# Patient Record
Sex: Male | Born: 1945
Health system: Southern US, Community
[De-identification: ages and names within clinical notes are randomized; demographics above are authoritative.]

## PROBLEM LIST (undated history)

## (undated) DIAGNOSIS — G4733 Obstructive sleep apnea (adult) (pediatric): Secondary | ICD-10-CM

## (undated) DIAGNOSIS — R200 Anesthesia of skin: Secondary | ICD-10-CM

## (undated) DIAGNOSIS — J302 Other seasonal allergic rhinitis: Secondary | ICD-10-CM

## (undated) DIAGNOSIS — I499 Cardiac arrhythmia, unspecified: Secondary | ICD-10-CM

## (undated) DIAGNOSIS — R202 Paresthesia of skin: Secondary | ICD-10-CM

## (undated) DIAGNOSIS — I10 Essential (primary) hypertension: Secondary | ICD-10-CM

## (undated) DIAGNOSIS — M1990 Unspecified osteoarthritis, unspecified site: Secondary | ICD-10-CM

## (undated) HISTORY — DX: Obstructive sleep apnea (adult) (pediatric): G47.33

## (undated) HISTORY — PX: ROTATOR CUFF REPAIR: SHX139

## (undated) HISTORY — PX: COLONOSCOPY W/ BIOPSIES AND POLYPECTOMY: SHX1376

---

## 2004-03-08 ENCOUNTER — Encounter: Admission: RE | Admit: 2004-03-08 | Discharge: 2004-03-08 | Payer: Self-pay | Admitting: Emergency Medicine

## 2004-03-09 ENCOUNTER — Encounter: Admission: RE | Admit: 2004-03-09 | Discharge: 2004-03-09 | Payer: Self-pay | Admitting: Emergency Medicine

## 2005-03-26 ENCOUNTER — Encounter: Admission: RE | Admit: 2005-03-26 | Discharge: 2005-03-26 | Payer: Self-pay | Admitting: Emergency Medicine

## 2005-04-22 ENCOUNTER — Encounter: Admission: RE | Admit: 2005-04-22 | Discharge: 2005-04-22 | Payer: Self-pay | Admitting: Emergency Medicine

## 2005-05-12 ENCOUNTER — Encounter: Admission: RE | Admit: 2005-05-12 | Discharge: 2005-05-12 | Payer: Self-pay | Admitting: Emergency Medicine

## 2005-10-13 ENCOUNTER — Encounter: Admission: RE | Admit: 2005-10-13 | Discharge: 2005-10-13 | Payer: Self-pay | Admitting: Emergency Medicine

## 2005-11-03 ENCOUNTER — Encounter: Payer: Self-pay | Admitting: Emergency Medicine

## 2006-06-08 ENCOUNTER — Encounter: Admission: RE | Admit: 2006-06-08 | Discharge: 2006-06-08 | Payer: Self-pay | Admitting: Emergency Medicine

## 2006-07-13 ENCOUNTER — Encounter: Admission: RE | Admit: 2006-07-13 | Discharge: 2006-07-13 | Payer: Self-pay | Admitting: Emergency Medicine

## 2006-08-25 ENCOUNTER — Encounter: Admission: RE | Admit: 2006-08-25 | Discharge: 2006-08-25 | Payer: Self-pay | Admitting: Emergency Medicine

## 2006-08-30 ENCOUNTER — Encounter: Admission: RE | Admit: 2006-08-30 | Discharge: 2006-08-30 | Payer: Self-pay | Admitting: Emergency Medicine

## 2006-12-23 ENCOUNTER — Encounter: Admission: RE | Admit: 2006-12-23 | Discharge: 2006-12-23 | Payer: Self-pay | Admitting: Orthopedic Surgery

## 2007-01-26 ENCOUNTER — Encounter: Admission: RE | Admit: 2007-01-26 | Discharge: 2007-01-26 | Payer: Self-pay | Admitting: Orthopedic Surgery

## 2007-02-16 ENCOUNTER — Encounter: Admission: RE | Admit: 2007-02-16 | Discharge: 2007-02-16 | Payer: Self-pay | Admitting: Orthopedic Surgery

## 2007-10-09 ENCOUNTER — Encounter: Admission: RE | Admit: 2007-10-09 | Discharge: 2007-10-09 | Payer: Self-pay | Admitting: Emergency Medicine

## 2007-10-19 ENCOUNTER — Encounter: Admission: RE | Admit: 2007-10-19 | Discharge: 2007-10-19 | Payer: Self-pay | Admitting: Emergency Medicine

## 2007-11-08 ENCOUNTER — Encounter: Admission: RE | Admit: 2007-11-08 | Discharge: 2007-11-08 | Payer: Self-pay | Admitting: Neurosurgery

## 2007-12-15 ENCOUNTER — Encounter: Admission: RE | Admit: 2007-12-15 | Discharge: 2007-12-15 | Payer: Self-pay | Admitting: Neurosurgery

## 2008-01-14 ENCOUNTER — Encounter: Admission: RE | Admit: 2008-01-14 | Discharge: 2008-01-14 | Payer: Self-pay | Admitting: Neurosurgery

## 2008-02-08 ENCOUNTER — Encounter: Admission: RE | Admit: 2008-02-08 | Discharge: 2008-02-08 | Payer: Self-pay | Admitting: Neurosurgery

## 2008-06-09 ENCOUNTER — Encounter: Admission: RE | Admit: 2008-06-09 | Discharge: 2008-06-09 | Payer: Self-pay | Admitting: Emergency Medicine

## 2008-07-05 ENCOUNTER — Encounter: Admission: RE | Admit: 2008-07-05 | Discharge: 2008-07-05 | Payer: Self-pay | Admitting: Emergency Medicine

## 2008-08-15 ENCOUNTER — Encounter: Admission: RE | Admit: 2008-08-15 | Discharge: 2008-08-15 | Payer: Self-pay | Admitting: Emergency Medicine

## 2010-07-14 ENCOUNTER — Encounter: Payer: Self-pay | Admitting: Neurosurgery

## 2010-07-22 ENCOUNTER — Encounter
Admission: RE | Admit: 2010-07-22 | Discharge: 2010-07-22 | Payer: Self-pay | Source: Home / Self Care | Attending: Family Medicine | Admitting: Family Medicine

## 2010-07-25 ENCOUNTER — Other Ambulatory Visit: Payer: Self-pay | Admitting: Family Medicine

## 2010-07-25 DIAGNOSIS — E871 Hypo-osmolality and hyponatremia: Secondary | ICD-10-CM

## 2010-07-29 ENCOUNTER — Ambulatory Visit
Admission: RE | Admit: 2010-07-29 | Discharge: 2010-07-29 | Disposition: A | Discharge status: Home / Self Care | Payer: BC Managed Care – PPO | Source: Ambulatory Visit | Attending: Family Medicine | Admitting: Family Medicine

## 2010-07-29 DIAGNOSIS — E871 Hypo-osmolality and hyponatremia: Secondary | ICD-10-CM

## 2010-07-29 MED ORDER — IOHEXOL 300 MG/ML  SOLN
75.0000 mL | Freq: Once | INTRAMUSCULAR | Status: AC | PRN
  Administered 2010-07-29: 75 mL via INTRAVENOUS

## 2013-03-10 ENCOUNTER — Other Ambulatory Visit: Payer: Self-pay | Admitting: Neurological Surgery

## 2013-03-10 DIAGNOSIS — M48061 Spinal stenosis, lumbar region without neurogenic claudication: Secondary | ICD-10-CM

## 2013-03-11 ENCOUNTER — Ambulatory Visit
Admission: RE | Admit: 2013-03-11 | Discharge: 2013-03-11 | Disposition: A | Discharge status: Home / Self Care | Payer: Medicare HMO | Source: Ambulatory Visit | Attending: Neurological Surgery | Admitting: Neurological Surgery

## 2013-03-11 DIAGNOSIS — M48061 Spinal stenosis, lumbar region without neurogenic claudication: Secondary | ICD-10-CM

## 2013-03-14 ENCOUNTER — Other Ambulatory Visit: Payer: Self-pay | Admitting: Neurological Surgery

## 2013-03-18 ENCOUNTER — Encounter (HOSPITAL_COMMUNITY): Payer: Self-pay | Admitting: Pharmacy Technician

## 2013-03-22 ENCOUNTER — Encounter (HOSPITAL_COMMUNITY): Payer: Self-pay

## 2013-03-22 ENCOUNTER — Encounter (HOSPITAL_COMMUNITY)
Admission: RE | Admit: 2013-03-22 | Discharge: 2013-03-22 | Disposition: A | Discharge status: Home / Self Care | Payer: Medicare HMO | Source: Ambulatory Visit | Attending: Neurological Surgery | Admitting: Neurological Surgery

## 2013-03-22 ENCOUNTER — Encounter (HOSPITAL_COMMUNITY)
Admission: RE | Admit: 2013-03-22 | Discharge: 2013-03-22 | Disposition: A | Discharge status: Home / Self Care | Payer: Medicare HMO | Source: Ambulatory Visit | Attending: Anesthesiology | Admitting: Anesthesiology

## 2013-03-22 HISTORY — DX: Unspecified osteoarthritis, unspecified site: M19.90

## 2013-03-22 HISTORY — DX: Paresthesia of skin: R20.2

## 2013-03-22 HISTORY — DX: Paresthesia of skin: R20.0

## 2013-03-22 HISTORY — DX: Essential (primary) hypertension: I10

## 2013-03-22 LAB — SURGICAL PCR SCREEN
MRSA, PCR: NEGATIVE
Staphylococcus aureus: POSITIVE — AB

## 2013-03-22 LAB — BASIC METABOLIC PANEL
BUN: 13 mg/dL (ref 6–23)
CO2: 26 mEq/L (ref 19–32)
Chloride: 100 mEq/L (ref 96–112)
GFR calc Af Amer: >90 mL/min (ref >90)
Potassium: 4.5 mEq/L (ref 3.5–5.1)

## 2013-03-22 LAB — CBC
HCT: 39.4 % (ref 39.0–52.0)
Hemoglobin: 14.2 g/dL (ref 13.0–17.0)
MCV: 91.6 fL (ref 78.0–100.0)
WBC: 6.4 10*3/uL (ref 4.0–10.5)

## 2013-03-22 NOTE — Progress Notes (Signed)
Anesthesia Chart Review:  Patient is a 67 year old male scheduled for L3-4, L4-5 laminectomy/foraminotomy on 03/24/13 by Dr. Yetta Barre. History of HTN, arthritis, former smoker, obesity (BMI 36.95). PCP is Dr. Blair Heys, [phone].   I was asked to review EKGs X 2 done today during his PAT visit.  Patient was asked to wait, but left before I saw him.  EKGs were done at 1609 and 1611.  The first EKG showed a critical result of second degree AVB (Mobitz 1).  The later EKG showed SR with PACs, incomplete right BBB.  I called and spoke with cardiologist Dr. Anne Fu.  I reviewed the 1609 EKG results with him since he did not have a tracing available at his location.  He was able to view the 1611 EKG which he felt also showed evidence of Wenckebach.  I then reviewed patient's plans for surgery, history, and medications with Dr. Delanna Notice whether he felt this patient would benefit from a preoperative cardiology evaluation.  Dr. Anne Fu felt that if patient was asymptomatic then a 2nd degree AVB, Mobitz 1 would not preclude him from undergoing surgery.  I called and spoke with Damon Weaver and reviewed his EKG findings.  He denied chest pain, SOB, syncope/pre-syncope, CHF/edema.  I informed him of Dr. Judd Gaudier input and that I would forward findings to Dr. Manus Gunning and Dr. Yetta Barre.  Meds: MVI, lisinopril, pravastatin.  CXR on 03/22/13 showed no acute abnormalities.  Preoperative labs noted--CBC and BMET WNL.  I reviewed EKG and input from Dr. Anne Fu with anesthesiologist Dr. Gypsy Balsam.  If no acute changes/new CV symptomology then anticipate that he could proceed as planned.  Patient will be on telemetry while under the care of his anesthesiologist.  He may benefit from post-operative telemetry as well.    Velna Ochs [facility]/Anesthesiology Phone [phone] 03/22/2013 5:35 PM

## 2013-03-22 NOTE — Progress Notes (Signed)
Pt chart left for [place], [place] (anesthesia) to review pt chest x ray.

## 2013-03-22 NOTE — Pre-Procedure Instructions (Signed)
Damon Weaver  03/22/2013   Your procedure is scheduled on: Thursday, March 24, 2013  Report to [facility] Short Stay (use Main Entrance "A') at 11:45 AM.  Call this number if you have problems the morning of surgery: [phone]   Remember:   Do not eat food or drink liquids after midnight.   Take these medicines the morning of surgery with A SIP OF WATER: None Stop taking Aspirin, Multivitamins, and herbal medications. Do not take any NSAIDs ie: Ibuprofen, Advil, Naproxen or any medication containing Aspirin.  Do not wear jewelry, make-up or nail polish.  Do not wear lotions, powders, or perfumes. You may wear deodorant.  Do not shave 48 hours prior to surgery. Men may shave face and neck.  Do not bring valuables to the hospital.  [facility] is not responsible for any belongings or valuables.               Contacts, dentures or bridgework may not be worn into surgery.  Leave suitcase in the car. After surgery it may be brought to your room.  For patients admitted to the hospital, discharge time is determined by your treatment team.               Patients discharged the day of surgery will not be allowed to drive home.  Name and phone number of your driver:   Special Instructions: Shower using CHG 2 nights before surgery and the night before surgery.  If you shower the day of surgery use CHG.  Use special wash - you have one bottle of CHG for all showers.  You should use approximately 1/3 of the bottle for each shower.   Please read over the following fact sheets that you were given: Pain Booklet, Coughing and Deep Breathing, Blood Transfusion Information, MRSA Information and Surgical Site Infection Prevention

## 2013-03-22 NOTE — Progress Notes (Signed)
03/22/13 1528  OBSTRUCTIVE SLEEP APNEA  Have you ever been diagnosed with sleep apnea through a sleep study? No  Do you snore loudly (loud enough to be heard through closed doors)?  0  Do you often feel tired, fatigued, or sleepy during the daytime? 0  Has anyone observed you stop breathing during your sleep? 0  Do you have, or are you being treated for high blood pressure? 1  BMI more than 35 kg/m2? 1  Age over 65 years old? 1  Neck circumference greater than 40 cm/18 inches? 0  Gender: 1  Obstructive Sleep Apnea Score 4  Score 4 or greater  Results sent to PCP

## 2013-03-22 NOTE — Progress Notes (Signed)
Pt denies SOB, chest pain, and being under the care of a cardiologist. Pt denies having a chest x ray and EKG within the last year and any cardiac studies. Pt PCP made aware of STOP-BANG Assessment Tool results.

## 2013-03-22 NOTE — Progress Notes (Signed)
Revonda Standard, PA (anesthesia)  reviewed pt abnormal EKG.

## 2013-03-23 MED ORDER — CEFAZOLIN SODIUM-DEXTROSE 2-3 GM-% IV SOLR
2.0000 g | INTRAVENOUS | Status: AC
  Administered 2013-03-24: 2 g via INTRAVENOUS
  Filled 2013-03-23: qty 50

## 2013-03-23 NOTE — Progress Notes (Signed)
Called patient and inst to arrive at 1100. Message left on ans machine

## 2013-03-24 ENCOUNTER — Encounter (HOSPITAL_COMMUNITY): Payer: Self-pay | Admitting: Vascular Surgery

## 2013-03-24 ENCOUNTER — Inpatient Hospital Stay (HOSPITAL_COMMUNITY)
Admission: RE | Admit: 2013-03-24 | Discharge: 2013-03-25 | DRG: 520 | Disposition: A | Discharge status: Home / Self Care | Payer: Medicare HMO | Source: Ambulatory Visit | Attending: Neurological Surgery | Admitting: Neurological Surgery

## 2013-03-24 ENCOUNTER — Encounter (HOSPITAL_COMMUNITY): Payer: Self-pay | Admitting: Surgery

## 2013-03-24 ENCOUNTER — Encounter (HOSPITAL_COMMUNITY)
Admission: RE | Disposition: A | Discharge status: Home / Self Care | Payer: Self-pay | Source: Ambulatory Visit | Attending: Neurological Surgery

## 2013-03-24 ENCOUNTER — Inpatient Hospital Stay (HOSPITAL_COMMUNITY): Payer: Medicare HMO | Admitting: Anesthesiology

## 2013-03-24 ENCOUNTER — Inpatient Hospital Stay (HOSPITAL_COMMUNITY): Payer: Medicare HMO

## 2013-03-24 DIAGNOSIS — Z87891 Personal history of nicotine dependence: Secondary | ICD-10-CM

## 2013-03-24 DIAGNOSIS — Z9889 Other specified postprocedural states: Secondary | ICD-10-CM

## 2013-03-24 DIAGNOSIS — I10 Essential (primary) hypertension: Secondary | ICD-10-CM | POA: Diagnosis present

## 2013-03-24 DIAGNOSIS — M48061 Spinal stenosis, lumbar region without neurogenic claudication: Principal | ICD-10-CM | POA: Diagnosis present

## 2013-03-24 DIAGNOSIS — Z23 Encounter for immunization: Secondary | ICD-10-CM

## 2013-03-24 DIAGNOSIS — Z8261 Family history of arthritis: Secondary | ICD-10-CM

## 2013-03-24 DIAGNOSIS — Z79899 Other long term (current) drug therapy: Secondary | ICD-10-CM

## 2013-03-24 DIAGNOSIS — Z01812 Encounter for preprocedural laboratory examination: Secondary | ICD-10-CM

## 2013-03-24 HISTORY — PX: LUMBAR LAMINECTOMY/DECOMPRESSION MICRODISCECTOMY: SHX5026

## 2013-03-24 SURGERY — LUMBAR LAMINECTOMY/DECOMPRESSION MICRODISCECTOMY 2 LEVELS
Anesthesia: General | Site: Back | Wound class: Clean

## 2013-03-24 MED ORDER — SODIUM CHLORIDE 0.9 % IJ SOLN: 3.0000 mL | INTRAMUSCULAR | Status: DC | PRN

## 2013-03-24 MED ORDER — ONDANSETRON HCL 4 MG/2ML IJ SOLN
INTRAMUSCULAR | Status: DC | PRN
  Administered 2013-03-24: 4 mg via INTRAVENOUS

## 2013-03-24 MED ORDER — LACTATED RINGERS IV SOLN
INTRAVENOUS | Status: DC
  Administered 2013-03-24: 11:00:00 via INTRAVENOUS

## 2013-03-24 MED ORDER — ONDANSETRON HCL 4 MG/2ML IJ SOLN: 4.0000 mg | INTRAMUSCULAR | Status: DC | PRN

## 2013-03-24 MED ORDER — OXYCODONE-ACETAMINOPHEN 5-325 MG PO TABS
1.0000 | ORAL_TABLET | ORAL | Status: DC | PRN
  Administered 2013-03-25: 2 via ORAL
  Filled 2013-03-24: qty 2

## 2013-03-24 MED ORDER — LIDOCAINE HCL (CARDIAC) 20 MG/ML IV SOLN
INTRAVENOUS | Status: DC | PRN
  Administered 2013-03-24: 80 mg via INTRAVENOUS

## 2013-03-24 MED ORDER — MORPHINE SULFATE 2 MG/ML IJ SOLN: 1.0000 mg | INTRAMUSCULAR | Status: DC | PRN

## 2013-03-24 MED ORDER — LACTATED RINGERS IV SOLN
INTRAVENOUS | Status: DC | PRN
  Administered 2013-03-24 (×3): via INTRAVENOUS

## 2013-03-24 MED ORDER — DEXAMETHASONE SODIUM PHOSPHATE 4 MG/ML IJ SOLN
4.0000 mg | Freq: Four times a day (QID) | INTRAMUSCULAR | Status: DC
  Filled 2013-03-24 (×4): qty 1

## 2013-03-24 MED ORDER — PROPOFOL 10 MG/ML IV BOLUS
INTRAVENOUS | Status: DC | PRN
  Administered 2013-03-24: 200 mg via INTRAVENOUS

## 2013-03-24 MED ORDER — GLYCOPYRROLATE 0.2 MG/ML IJ SOLN
INTRAMUSCULAR | Status: DC | PRN
  Administered 2013-03-24: .8 mg via INTRAVENOUS
  Administered 2013-03-24: 0.2 mg via INTRAVENOUS

## 2013-03-24 MED ORDER — THROMBIN 5000 UNITS EX SOLR
CUTANEOUS | Status: DC | PRN
  Administered 2013-03-24 (×2): 5000 [IU] via TOPICAL

## 2013-03-24 MED ORDER — ARTIFICIAL TEARS OP OINT
TOPICAL_OINTMENT | OPHTHALMIC | Status: DC | PRN
  Administered 2013-03-24: 1 via OPHTHALMIC

## 2013-03-24 MED ORDER — MIDAZOLAM HCL 5 MG/5ML IJ SOLN
INTRAMUSCULAR | Status: DC | PRN
  Administered 2013-03-24: 2 mg via INTRAVENOUS

## 2013-03-24 MED ORDER — PHENOL 1.4 % MT LIQD: 1.0000 | OROMUCOSAL | Status: DC | PRN

## 2013-03-24 MED ORDER — ACETAMINOPHEN 650 MG RE SUPP: 650.0000 mg | RECTAL | Status: DC | PRN

## 2013-03-24 MED ORDER — CEFAZOLIN SODIUM 1-5 GM-% IV SOLN
1.0000 g | Freq: Three times a day (TID) | INTRAVENOUS | Status: AC
  Administered 2013-03-24 – 2013-03-25 (×2): 1 g via INTRAVENOUS
  Filled 2013-03-24 (×3): qty 50

## 2013-03-24 MED ORDER — METHOCARBAMOL 100 MG/ML IJ SOLN
500.0000 mg | Freq: Four times a day (QID) | INTRAVENOUS | Status: DC | PRN
  Filled 2013-03-24: qty 5

## 2013-03-24 MED ORDER — DEXAMETHASONE SODIUM PHOSPHATE 4 MG/ML IJ SOLN
INTRAMUSCULAR | Status: DC | PRN
  Administered 2013-03-24: 8 mg via INTRAVENOUS

## 2013-03-24 MED ORDER — INFLUENZA VAC SPLIT QUAD 0.5 ML IM SUSP
0.5000 mL | INTRAMUSCULAR | Status: AC
  Administered 2013-03-25: 0.5 mL via INTRAMUSCULAR
  Filled 2013-03-24: qty 0.5

## 2013-03-24 MED ORDER — OXYCODONE HCL 5 MG PO TABS
ORAL_TABLET | ORAL | Status: AC
  Administered 2013-03-24: 5 mg
  Filled 2013-03-24: qty 1

## 2013-03-24 MED ORDER — ROCURONIUM BROMIDE 100 MG/10ML IV SOLN
INTRAVENOUS | Status: DC | PRN
  Administered 2013-03-24: 10 mg via INTRAVENOUS
  Administered 2013-03-24: 20 mg via INTRAVENOUS
  Administered 2013-03-24: 50 mg via INTRAVENOUS
  Administered 2013-03-24 (×2): 10 mg via INTRAVENOUS

## 2013-03-24 MED ORDER — BUPIVACAINE HCL (PF) 0.25 % IJ SOLN
INTRAMUSCULAR | Status: DC | PRN
  Administered 2013-03-24: 3 mL

## 2013-03-24 MED ORDER — ACETAMINOPHEN 325 MG PO TABS: 650.0000 mg | ORAL_TABLET | ORAL | Status: DC | PRN

## 2013-03-24 MED ORDER — METHOCARBAMOL 500 MG PO TABS
500.0000 mg | ORAL_TABLET | Freq: Four times a day (QID) | ORAL | Status: DC | PRN
  Filled 2013-03-24 (×2): qty 1

## 2013-03-24 MED ORDER — FENTANYL CITRATE 0.05 MG/ML IJ SOLN
INTRAMUSCULAR | Status: DC | PRN
  Administered 2013-03-24: 50 ug via INTRAVENOUS
  Administered 2013-03-24: 100 ug via INTRAVENOUS
  Administered 2013-03-24: 150 ug via INTRAVENOUS
  Administered 2013-03-24: 50 ug via INTRAVENOUS

## 2013-03-24 MED ORDER — LISINOPRIL 10 MG PO TABS
10.0000 mg | ORAL_TABLET | Freq: Every day | ORAL | Status: DC
  Administered 2013-03-24: 10 mg via ORAL
  Filled 2013-03-24 (×2): qty 1

## 2013-03-24 MED ORDER — NEOSTIGMINE METHYLSULFATE 1 MG/ML IJ SOLN
INTRAMUSCULAR | Status: DC | PRN
  Administered 2013-03-24: 5 mg via INTRAVENOUS

## 2013-03-24 MED ORDER — THROMBIN 5000 UNITS EX SOLR
OROMUCOSAL | Status: DC | PRN
  Administered 2013-03-24: 15:00:00 via TOPICAL

## 2013-03-24 MED ORDER — SODIUM CHLORIDE 0.9 % IV SOLN: 250.0000 mL | INTRAVENOUS | Status: DC

## 2013-03-24 MED ORDER — OXYCODONE HCL 5 MG PO TABS: 5.0000 mg | ORAL_TABLET | Freq: Once | ORAL | Status: DC

## 2013-03-24 MED ORDER — 0.9 % SODIUM CHLORIDE (POUR BTL) OPTIME
TOPICAL | Status: DC | PRN
  Administered 2013-03-24: 1000 mL

## 2013-03-24 MED ORDER — POTASSIUM CHLORIDE IN NACL 20-0.9 MEQ/L-% IV SOLN
INTRAVENOUS | Status: DC
  Filled 2013-03-24 (×3): qty 1000

## 2013-03-24 MED ORDER — MENTHOL 3 MG MT LOZG: 1.0000 | LOZENGE | OROMUCOSAL | Status: DC | PRN

## 2013-03-24 MED ORDER — DEXAMETHASONE 4 MG PO TABS
4.0000 mg | ORAL_TABLET | Freq: Four times a day (QID) | ORAL | Status: DC
  Administered 2013-03-24 – 2013-03-25 (×3): 4 mg via ORAL
  Filled 2013-03-24 (×7): qty 1

## 2013-03-24 MED ORDER — BACITRACIN 50000 UNITS IM SOLR
INTRAMUSCULAR | Status: DC | PRN
  Administered 2013-03-24: 13:00:00

## 2013-03-24 MED ORDER — MUPIROCIN 2 % EX OINT
TOPICAL_OINTMENT | Freq: Two times a day (BID) | CUTANEOUS | Status: DC
  Administered 2013-03-24: 23:00:00 via NASAL
  Filled 2013-03-24: qty 22

## 2013-03-24 MED ORDER — SODIUM CHLORIDE 0.9 % IJ SOLN
3.0000 mL | Freq: Two times a day (BID) | INTRAMUSCULAR | Status: DC
  Administered 2013-03-24: 3 mL via INTRAVENOUS

## 2013-03-24 MED ORDER — HEMOSTATIC AGENTS (NO CHARGE) OPTIME
TOPICAL | Status: DC | PRN
  Administered 2013-03-24: 1 via TOPICAL

## 2013-03-24 MED ORDER — SENNA 8.6 MG PO TABS
1.0000 | ORAL_TABLET | Freq: Two times a day (BID) | ORAL | Status: DC
  Administered 2013-03-24: 8.6 mg via ORAL
  Filled 2013-03-24 (×3): qty 1

## 2013-03-24 SURGICAL SUPPLY — 52 items
ADH SKN CLS APL DERMABOND .7 (GAUZE/BANDAGES/DRESSINGS) ×1
APL SKNCLS STERI-STRIP NONHPOA (GAUZE/BANDAGES/DRESSINGS) ×1
BAG DECANTER FOR FLEXI CONT (MISCELLANEOUS) ×2 IMPLANT
BENZOIN TINCTURE PRP APPL 2/3 (GAUZE/BANDAGES/DRESSINGS) ×2 IMPLANT
BUR MATCHSTICK NEURO 3.0 LAGG (BURR) ×2 IMPLANT
CANISTER SUCTION 2500CC (MISCELLANEOUS) ×2 IMPLANT
CLOTH BEACON ORANGE TIMEOUT ST (SAFETY) IMPLANT
CONT SPEC 4OZ CLIKSEAL STRL BL (MISCELLANEOUS) ×2 IMPLANT
DERMABOND ADVANCED (GAUZE/BANDAGES/DRESSINGS) ×1
DERMABOND ADVANCED .7 DNX12 (GAUZE/BANDAGES/DRESSINGS) IMPLANT
DRAPE LAPAROTOMY 100X72X124 (DRAPES) ×2 IMPLANT
DRAPE MICROSCOPE LEICA (MISCELLANEOUS) ×2 IMPLANT
DRAPE POUCH INSTRU U-SHP 10X18 (DRAPES) ×2 IMPLANT
DRAPE SURG 17X23 STRL (DRAPES) ×2 IMPLANT
DRESSING TELFA 8X3 (GAUZE/BANDAGES/DRESSINGS) ×1 IMPLANT
DRSG OPSITE 4X5.5 SM (GAUZE/BANDAGES/DRESSINGS) ×1 IMPLANT
DRSG OPSITE POSTOP 4X6 (GAUZE/BANDAGES/DRESSINGS) ×1 IMPLANT
DURAPREP 26ML APPLICATOR (WOUND CARE) ×2 IMPLANT
ELECT BLADE 4.0 EZ CLEAN MEGAD (MISCELLANEOUS) ×2
ELECT REM PT RETURN 9FT ADLT (ELECTROSURGICAL) ×2
ELECTRODE BLDE 4.0 EZ CLN MEGD (MISCELLANEOUS) ×1 IMPLANT
ELECTRODE REM PT RTRN 9FT ADLT (ELECTROSURGICAL) ×1 IMPLANT
EVACUATOR 1/8 PVC DRAIN (DRAIN) ×1 IMPLANT
GAUZE SPONGE 4X4 16PLY XRAY LF (GAUZE/BANDAGES/DRESSINGS) IMPLANT
GLOVE BIO SURGEON STRL SZ 6.5 (GLOVE) ×2 IMPLANT
GLOVE BIO SURGEON STRL SZ8 (GLOVE) ×2 IMPLANT
GLOVE BIOGEL PI IND STRL 6.5 (GLOVE) IMPLANT
GLOVE BIOGEL PI INDICATOR 6.5 (GLOVE) ×1
GOWN BRE IMP SLV AUR LG STRL (GOWN DISPOSABLE) ×2 IMPLANT
GOWN BRE IMP SLV AUR XL STRL (GOWN DISPOSABLE) ×2 IMPLANT
GOWN STRL REIN 2XL LVL4 (GOWN DISPOSABLE) IMPLANT
HEMOSTAT POWDER KIT SURGIFOAM (HEMOSTASIS) IMPLANT
KIT BASIN OR (CUSTOM PROCEDURE TRAY) ×2 IMPLANT
KIT ROOM TURNOVER OR (KITS) ×2 IMPLANT
NDL HYPO 25X1 1.5 SAFETY (NEEDLE) ×1 IMPLANT
NDL SPNL 20GX3.5 QUINCKE YW (NEEDLE) IMPLANT
NEEDLE HYPO 25X1 1.5 SAFETY (NEEDLE) ×2 IMPLANT
NEEDLE SPNL 20GX3.5 QUINCKE YW (NEEDLE) IMPLANT
NS IRRIG 1000ML POUR BTL (IV SOLUTION) ×2 IMPLANT
PACK LAMINECTOMY NEURO (CUSTOM PROCEDURE TRAY) ×2 IMPLANT
PAD ARMBOARD 7.5X6 YLW CONV (MISCELLANEOUS) ×6 IMPLANT
RUBBERBAND STERILE (MISCELLANEOUS) ×4 IMPLANT
SPONGE SURGIFOAM ABS GEL SZ50 (HEMOSTASIS) ×3 IMPLANT
STRIP CLOSURE SKIN 1/2X4 (GAUZE/BANDAGES/DRESSINGS) ×2 IMPLANT
SUT VIC AB 0 CT1 18XCR BRD8 (SUTURE) ×1 IMPLANT
SUT VIC AB 0 CT1 8-18 (SUTURE) ×2
SUT VIC AB 2-0 CP2 18 (SUTURE) ×2 IMPLANT
SUT VIC AB 3-0 SH 8-18 (SUTURE) ×4 IMPLANT
SYR 20ML ECCENTRIC (SYRINGE) ×2 IMPLANT
TOWEL OR 17X24 6PK STRL BLUE (TOWEL DISPOSABLE) ×2 IMPLANT
TOWEL OR 17X26 10 PK STRL BLUE (TOWEL DISPOSABLE) ×2 IMPLANT
WATER STERILE IRR 1000ML POUR (IV SOLUTION) ×2 IMPLANT

## 2013-03-24 NOTE — Op Note (Signed)
03/24/2013  4:05 PM  PATIENT:  Damon Weaver  67 y.o. male  PRE-OPERATIVE DIAGNOSIS:  Lumbar spinal stenosis L3-4, L4-5 with left leg pain  POST-OPERATIVE DIAGNOSIS:  same  PROCEDURE:  Decompressive lumbar hemilaminectomy, medial facetectomy, and foraminotomy L3-4 and L4-5 on the left, with sublaminar decompression L3-4  SURGEON:  Marikay Alar, MD  ASSISTANTS: Dr. Gerlene Fee  ANESTHESIA:   General  EBL: 100 ml  Total I/O In: 2000 [I.V.:2000] Out: 100 [Blood:100]  BLOOD ADMINISTERED:none  DRAINS: med hemovac   SPECIMEN:  No Specimen  INDICATION FOR PROCEDURE: This patient presented with severe left leg pain. It been there for quite a long time. It had failed to respond to medical therapy. MRI showed spinal stenosis at L3-4 and L4-5. I recommended decompressive laminectomy. Patient understood the risks, benefits, and alternatives and potential outcomes and wished to proceed.  PROCEDURE DETAILS: The patient was taken to the operating room and after induction of adequate generalized endotracheal anesthesia, the patient was rolled into the prone position on the Wilson frame and all pressure points were padded. The lumbar region was cleaned and then prepped with DuraPrep and draped in the usual sterile fashion. 5 cc of local anesthesia was injected and then a dorsal midline incision was made and carried down to the lumbo sacral fascia. The fascia was opened and the paraspinous musculature was taken down in a subperiosteal fashion to expose L3-4 and L4-5 on the left. Intraoperative x-ray confirmed my level, and then I used a combination of the high-speed drill and the Kerrison punches to perform a hemilaminectomy, medial facetectomy, and foraminotomy at L3-4 and L4-5 on the left. The underlying yellow ligament was opened and removed in a piecemeal fashion to expose the underlying dura and exiting nerve root. I undercut the lateral recess and dissected down until I was medial to and distal to  the pedicle. The nerve root was well decompressed at both levels. At L4-5 on the left there was a large piece of bone fragment that was loose from the facet was compressing the left L5 nerve root. I continued to tease this loose from its ligamentous attachments with I nerve hook and final removed a large chunk of bone from the surface of the nerve root. We then gently retracted the nerve root medially with a retractor, coagulated the epidural venous vasculature, and i inspected the disc space. I found only small subannular disc herniations that I felt did not need discectomy. I then palpated with a coronary dilator along the nerve root and into the foramen to assure adequate decompression. I felt no more compression of the nerve root. I irrigated with saline solution containing bacitracin. Achieved hemostasis with bipolar cautery, lined the dura with Gelfoam, and then closed the fascia with 0 Vicryl. I closed the subcutaneous tissues with 2-0 Vicryl and the subcuticular tissues with 3-0 Vicryl. The skin was then closed with benzoin and Steri-Strips. The drapes were removed, a sterile dressing was applied. The patient was awakened from general anesthesia and transferred to the recovery room in stable condition. At the end of the procedure all sponge, needle and instrument counts were correct.   PLAN OF CARE: Admit to inpatient   PATIENT DISPOSITION:  PACU - hemodynamically stable.   Delay start of Pharmacological VTE agent (>24hrs) due to surgical blood loss or risk of bleeding:  yes

## 2013-03-24 NOTE — Anesthesia Preprocedure Evaluation (Addendum)
Anesthesia Evaluation  Patient identified by MRN, date of birth, ID band Patient awake    Reviewed: Allergy & Precautions, H&P , NPO status , Patient's Chart, lab work & pertinent test results, reviewed documented beta blocker date and time   Airway Mallampati: II TM Distance: >3 FB Neck ROM: full    Dental   Pulmonary neg pulmonary ROS,  breath sounds clear to auscultation        Cardiovascular hypertension, On Medications Rhythm:regular     Neuro/Psych negative neurological ROS  negative psych ROS   GI/Hepatic negative GI ROS, Neg liver ROS,   Endo/Other  negative endocrine ROS  Renal/GU negative Renal ROS  negative genitourinary   Musculoskeletal   Abdominal   Peds  Hematology negative hematology ROS (+)   Anesthesia Other Findings See surgeon's H&P   Reproductive/Obstetrics negative OB ROS                           Anesthesia Physical Anesthesia Plan  ASA: III  Anesthesia Plan: General   Post-op Pain Management:    Induction:   Airway Management Planned:   Additional Equipment: Arterial line  Intra-op Plan:   Post-operative Plan:   Informed Consent: I have reviewed the patients History and Physical, chart, labs and discussed the procedure including the risks, benefits and alternatives for the proposed anesthesia with the patient or authorized representative who has indicated his/her understanding and acceptance.   Dental Advisory Given  Plan Discussed with: CRNA and Surgeon  Anesthesia Plan Comments: (Will place pads for transthoracic pacing pre-operatively.)       Anesthesia Quick Evaluation

## 2013-03-24 NOTE — H&P (Signed)
Subjective: Patient is a 67 y.o. male admitted for DLL L3-4, L4-5. Onset of symptoms was several months ago, gradually worsening since that time.  The pain is rated moderate, and is located at the across the lower back and radiates to left more than right lower extremity. The pain is described as aching and occurs intermittently. The symptoms have been progressive. Symptoms are exacerbated by exercise. MRI or CT showed lumbar spinal stenosis L3-4 L4-5.   Past Medical History  Diagnosis Date  . Hypertension   . Numbness and tingling     Hx: of left leg  . Arthritis     Past Surgical History  Procedure Laterality Date  . Rotator cuff repair      Hx of right arm  . Colonoscopy w/ biopsies and polypectomy      Hx; of    Prior to Admission medications   Medication Sig Start Date End Date Taking? Authorizing Provider  lisinopril (PRINIVIL,ZESTRIL) 10 MG tablet Take 10 mg by mouth daily.   Yes Historical Provider, MD  Multiple Vitamin (MULTIVITAMIN) tablet Take 1 tablet by mouth daily.   Yes Historical Provider, MD  pravastatin (PRAVACHOL) 40 MG tablet Take 40 mg by mouth daily.   Yes Historical Provider, MD   No Known Allergies  History  Substance Use Topics  . Smoking status: Former Smoker    Quit date: 09/12/1981  . Smokeless tobacco: Never Used  . Alcohol Use: Not on file     Comment: daily    Family History  Problem Relation Age of Onset  . Arthritis Other      Review of Systems  Positive ROS: neg  All other systems have been reviewed and were otherwise negative with the exception of those mentioned in the HPI and as above.  Objective: Vital signs in last 24 hours: Temp:  [97.3 F (36.3 C)] 97.3 F (36.3 C) (10/02 1110) Pulse Rate:  [72] 72 (10/02 1110) Resp:  [20] 20 (10/02 1110) BP: (173)/(66) 173/66 mmHg (10/02 1110) SpO2:  [99 %] 99 % (10/02 1110)  General Appearance: Alert, cooperative, no distress, appears stated age Head: Normocephalic, without obvious  abnormality, atraumatic Eyes: PERRL, conjunctiva/corneas clear, EOM's intact    Neck: Supple, symmetrical, trachea midline Back: Symmetric, no curvature, ROM normal, no CVA tenderness Lungs:  respirations unlabored Heart: Regular rate and rhythm Abdomen: Soft, non-tender Extremities: Extremities normal, atraumatic, no cyanosis or edema Pulses: 2+ and symmetric all extremities Skin: Skin color, texture, turgor normal, no rashes or lesions  NEUROLOGIC:   Mental status: Alert and oriented x4,  no aphasia, good attention span, fund of knowledge, and memory Motor Exam - grossly normal Sensory Exam - grossly normal Reflexes: trace Coordination - grossly normal Gait - grossly normal Balance - grossly normal Cranial Nerves: I: smell Not tested  II: visual acuity  OS: nl    OD: nl  II: visual fields Full to confrontation  II: pupils Equal, round, reactive to light  III,VII: ptosis None  III,IV,VI: extraocular muscles  Full ROM  V: mastication Normal  V: facial light touch sensation  Normal  V,VII: corneal reflex  Present  VII: facial muscle function - upper  Normal  VII: facial muscle function - lower Normal  VIII: hearing Not tested  IX: soft palate elevation  Normal  IX,X: gag reflex Present  XI: trapezius strength  5/5  XI: sternocleidomastoid strength 5/5  XI: neck flexion strength  5/5  XII: tongue strength  Normal    Data Review Lab  Results  Component Value Date   WBC 6.4 03/22/2013   HGB 14.2 03/22/2013   HCT 39.4 03/22/2013   MCV 91.6 03/22/2013   PLT 172 03/22/2013   Lab Results  Component Value Date   NA 135 03/22/2013   K 4.5 03/22/2013   CL 100 03/22/2013   CO2 26 03/22/2013   BUN 13 03/22/2013   CREATININE 0.83 03/22/2013   GLUCOSE 92 03/22/2013   No results found for this basename: INR, PROTIME    Assessment/Plan: Patient admitted for decompressive laminectomy L3-4 L4-5. Patient has failed a reasonable attempt at conservative therapy.  I explained the  condition and procedure to the patient and answered any questions.  Patient wishes to proceed with procedure as planned. Understands risks/ benefits and typical outcomes of procedure.   Tysheem Accardo S 03/24/2013 1:05 PM

## 2013-03-24 NOTE — Anesthesia Postprocedure Evaluation (Signed)
Anesthesia Post Note  Patient: Damon Weaver  Procedure(s) Performed: Procedure(s) (LRB): LUMBAR LAMINECTOMY/DECOMPRESSION MICRODISCECTOMY LUMBAR THREE-FOUR,FOUR-FIVE (N/A)  Anesthesia type: General  Patient location: PACU  Post pain: Pain level controlled and Adequate analgesia  Post assessment: Post-op Vital signs reviewed, Patient's Cardiovascular Status Stable, Respiratory Function Stable, Patent Airway and Pain level controlled  Last Vitals:  Filed Vitals:   03/24/13 1654  BP:   Pulse: 59  Temp:   Resp: 20    Post vital signs: Reviewed and stable  Level of consciousness: awake, alert  and oriented  Complications: No apparent anesthesia complications

## 2013-03-24 NOTE — Transfer of Care (Signed)
Immediate Anesthesia Transfer of Care Note  Patient: Damon Weaver  Procedure(s) Performed: Procedure(s): LUMBAR LAMINECTOMY/DECOMPRESSION MICRODISCECTOMY LUMBAR THREE-FOUR,FOUR-FIVE (N/A)  Patient Location: PACU  Anesthesia Type:General  Level of Consciousness: awake, alert  and oriented  Airway & Oxygen Therapy: Patient Spontanous Breathing and Patient connected to nasal cannula oxygen  Post-op Assessment: Report given to PACU RN, Post -op Vital signs reviewed and stable and Patient moving all extremities X 4  Post vital signs: Reviewed and stable  Complications: No apparent anesthesia complications

## 2013-03-24 NOTE — Preoperative (Signed)
Beta Blockers   Reason not to administer Beta Blockers:Not Applicable 

## 2013-03-24 NOTE — Plan of Care (Signed)
Problem: Consults Goal: Diagnosis - Spinal Surgery Outcome: Completed/Met Date Met:  03/24/13 Lumbar Laminectomy (Complex)

## 2013-03-24 NOTE — Anesthesia Procedure Notes (Signed)
Procedure Name: Intubation Date/Time: 03/24/2013 2:02 PM Performed by: Gwenyth Allegra Pre-anesthesia Checklist: Emergency Drugs available, Patient identified, Timeout performed, Suction available and Patient being monitored Patient Re-evaluated:Patient Re-evaluated prior to inductionOxygen Delivery Method: Circle system utilized Intubation Type: IV induction Ventilation: Mask ventilation without difficulty and Oral airway inserted - appropriate to patient size Laryngoscope Size: Mac and 4 Grade View: Grade II Tube type: Oral Tube size: 8.0 mm Number of attempts: 1 Airway Equipment and Method: Stylet Placement Confirmation: ETT inserted through vocal cords under direct vision,  breath sounds checked- equal and bilateral and positive ETCO2 Secured at: 22 cm Dental Injury: Teeth and Oropharynx as per pre-operative assessment

## 2013-03-24 NOTE — Progress Notes (Signed)
Patient ID: Damon Weaver, male   DOB: [birth year], 67 y.o.   MRN: [id number] Looks great post-op. Good strength. No leg pain.

## 2013-03-25 ENCOUNTER — Encounter (HOSPITAL_COMMUNITY): Payer: Self-pay | Admitting: Neurological Surgery

## 2013-03-25 MED ORDER — OXYCODONE-ACETAMINOPHEN 5-325 MG PO TABS: 1.0000 | ORAL_TABLET | ORAL | Status: DC | PRN

## 2013-03-25 NOTE — Progress Notes (Signed)
Pt doing very well. Pt and wife given D/C instructions with Rx, verbal understanding was given. All questions were answered prior to D/C. Pt D/C'd home via walking per MD order @ 1100. Rema Fendt, RN

## 2013-03-25 NOTE — Discharge Summary (Signed)
Physician Discharge Summary  Patient ID: Damon Weaver MRN: [id number] DOB/AGE: Oct 27, 1945 67 y.o.  Admit date: 03/24/2013 Discharge date: 03/25/2013  Admission Diagnoses: Lumbar spinal stenosis   Discharge Diagnoses: Same   Discharged Condition: good  Hospital Course: The patient was admitted on 03/24/2013 and taken to the operating room where the patient underwent decompressive laminectomy L3-4 L4-5. The patient tolerated the procedure well and was taken to the recovery room and then to the floor in stable condition. The hospital course was routine. There were no complications. The wound remained clean dry and intact. Pt had appropriate back soreness. No complaints of leg pain or new N/T/W. The patient remained afebrile with stable vital signs, and tolerated a regular diet. The patient continued to increase activities, and pain was well controlled with oral pain medications.   Consults: None  Significant Diagnostic Studies:  Results for orders placed during the hospital encounter of 03/22/13  SURGICAL PCR SCREEN      Result Value Range   MRSA, PCR NEGATIVE  NEGATIVE   Staphylococcus aureus POSITIVE (*) NEGATIVE  BASIC METABOLIC PANEL      Result Value Range   Sodium 135  135 - 145 mEq/L   Potassium 4.5  3.5 - 5.1 mEq/L   Chloride 100  96 - 112 mEq/L   CO2 26  19 - 32 mEq/L   Glucose, Bld 92  70 - 99 mg/dL   BUN 13  6 - 23 mg/dL   Creatinine, Ser 6.29  0.50 - 1.35 mg/dL   Calcium 9.4  8.4 - 52.8 mg/dL   GFR calc non Af Amer >90  >90 mL/min   GFR calc Af Amer >90  >90 mL/min  CBC      Result Value Range   WBC 6.4  4.0 - 10.5 K/uL   RBC 4.30  4.22 - 5.81 MIL/uL   Hemoglobin 14.2  13.0 - 17.0 g/dL   HCT 41.3  24.4 - 01.0 %   MCV 91.6  78.0 - 100.0 fL   MCH 33.0  26.0 - 34.0 pg   MCHC 36.0  30.0 - 36.0 g/dL   RDW 27.2  53.6 - 64.4 %   Platelets 172  150 - 400 K/uL    Dg Chest 2 View  03/22/2013   **ADDENDUM** CREATED: 03/22/2013 16:51:43  Correction:  Comparison to  07/22/10  **END ADDENDUM** SIGNED BY: Esperanza Heir, M.D.  03/22/2013   *RADIOLOGY REPORT*  Clinical Data: Preoperative study, hypertension  CHEST - 2 VIEW  Comparison: None.  Findings: The heart size and vascular pattern are normal.  Lungs are clear.  Mild right diaphragm elevation.  No pleural effusion. No change from prior study.Stable 6mm left upper lobe circumscribed dense oval nodule consistent with a benign granuloma.  IMPRESSION: No acute abnormalities   Original Report Authenticated By: Esperanza Heir, M.D.   Ct Lumbar Spine Wo Contrast  03/11/2013   CLINICAL DATA:  67 year old male with bilateral lower extremity pain left greater than right. Lumbar spondylolisthesis and spinal stenosis.  EXAM: CT LUMBAR SPINE WITHOUT CONTRAST  TECHNIQUE: Multidetector CT imaging of the lumbar spine was performed without intravenous contrast administration. Multiplanar CT image reconstructions were also generated.  COMPARISON:  [facility] lumbar MRI 01/24/2013.  FINDINGS: Same numbering system as on the comparison, designating grade 2 spondylolisthesis at L5-S1. Stable vertebral height and alignment.  Aortoiliac calcified atherosclerosis noted. Negative visualized non contrast abdominal viscera. Visualized paraspinal soft tissues are within normal limits.  Visualized sacrum and SI joints  are intact.  T12-L1: Vacuum disc phenomena.  No significant spinal stenosis.  L1-L2: Stable mild disc bulge.  No significant spinal stenosis.  L2-L3: Stable appearance of circumferential disc bulge plus mild to moderate facet and ligament flavum hypertrophy. Stable mild to moderate spinal stenosis.  L3-L4: Vacuum disc phenomena. Stable appearance of left eccentric circumferential disc bulge and moderate ligament flavum and facet hypertrophy resulting in moderate to severe spinal stenosis.  L4-L5: Stable appearance of left eccentric circumferential disc bulge. Bulky posterior element hypertrophy at this level,  in part related to the L5-S1 changes. These images suggest a greater degree of spinal stenosis than on the recent MRI, at least moderate.  L5-S1: CT confirms there is at L5-S1 interbody ankylosis. Stable grade 2 spondylolisthesis. Severe posterior element hypertrophy with chronic L5 pars fractures , facet fragmentation, and sclerosis. Stable lateral mass effect on the thecal sac without significant spinal stenosis.  IMPRESSION: 1. L5-S1 grade 2 spondylolisthesis re- identified with confirmed interbody ankylosis. Bulky posterior element hypertrophy/degeneration superimposed on chronic L5 pars fractures.  2. CT suggests a greater degree of multifactorial spinal stenosis (moderate) at L4-L5 than on the recent MRI.  3. Stable multifactorial lumbar spinal stenosis elsewhere.   Electronically Signed   By: Augusto Gamble M.D.   On: 03/11/2013 16:06   Dg Lumbar Spine 1 View  03/24/2013   CLINICAL DATA:  Lumbar laminectomy  EXAM: LUMBAR SPINE - 1 VIEW  COMPARISON:  CT 03/11/2013  FINDINGS: Numbering scheme follows that used on the previous exam. This single lateral intraoperative radiograph shows posterior surgical instruments, a probe tip projecting posterior to the L3-4 facets.  IMPRESSION: Intraoperative localization   Electronically Signed   By: Oley Balm M.D.   On: 03/24/2013 16:27    Antibiotics:  Anti-infectives   Start     Dose/Rate Route Frequency Ordered Stop   03/24/13 2100  ceFAZolin (ANCEF) IVPB 1 g/50 mL premix     1 g 100 mL/hr over 30 Minutes Intravenous Every 8 hours 03/24/13 1813 03/25/13 0512   03/24/13 1321  bacitracin 50,000 Units in sodium chloride irrigation 0.9 % 500 mL irrigation  Status:  Discontinued       As needed 03/24/13 1321 03/24/13 1618   03/24/13 0600  ceFAZolin (ANCEF) IVPB 2 g/50 mL premix     2 g 100 mL/hr over 30 Minutes Intravenous On call to O.R. 03/23/13 1304 03/24/13 1345      Discharge Exam: Blood pressure 130/72, pulse 77, temperature 98.9 F (37.2 C),  temperature source Oral, resp. rate 20, SpO2 94.00%. Neurologic: Grossly normal Incision clean and intact  Discharge Medications:     Medication List         lisinopril 10 MG tablet  Commonly known as:  PRINIVIL,ZESTRIL  Take 10 mg by mouth daily.     multivitamin tablet  Take 1 tablet by mouth daily.     oxyCODONE-acetaminophen 5-325 MG per tablet  Commonly known as:  PERCOCET/ROXICET  Take 1-2 tablets by mouth every 4 (four) hours as needed.     pravastatin 40 MG tablet  Commonly known as:  PRAVACHOL  Take 40 mg by mouth daily.        Disposition: Home   Final Dx: Decompressive laminectomy L3-4 L4-5      Discharge Orders   Future Orders Complete By Expires   Call MD for:  difficulty breathing, headache or visual disturbances  As directed    Call MD for:  persistant nausea and vomiting  As directed  Call MD for:  redness, tenderness, or signs of infection (pain, swelling, redness, odor or green/yellow discharge around incision site)  As directed    Call MD for:  severe uncontrolled pain  As directed    Call MD for:  temperature >100.4  As directed    Diet - low sodium heart healthy  As directed    Discharge instructions  As directed    Comments:     No strenuous activity   Increase activity slowly  As directed       Follow-up Information   Follow up with Irelyn Perfecto S, MD. Schedule an appointment as soon as possible for a visit in 2 weeks.   Specialty:  Neurosurgery   Contact information:   [address] [place] [address] [phone]        Signed: Tia Alert 03/25/2013, 10:41 AM

## 2013-03-25 NOTE — Progress Notes (Signed)
UR COMPLETED  

## 2013-03-30 ENCOUNTER — Encounter (HOSPITAL_COMMUNITY): Payer: Self-pay | Admitting: Emergency Medicine

## 2013-03-30 ENCOUNTER — Emergency Department (HOSPITAL_COMMUNITY)
Admission: EM | Admit: 2013-03-30 | Discharge: 2013-03-30 | Disposition: A | Discharge status: Home / Self Care | Payer: Medicare HMO | Attending: Emergency Medicine | Admitting: Emergency Medicine

## 2013-03-30 ENCOUNTER — Emergency Department (HOSPITAL_COMMUNITY): Payer: Medicare HMO

## 2013-03-30 DIAGNOSIS — M129 Arthropathy, unspecified: Secondary | ICD-10-CM | POA: Insufficient documentation

## 2013-03-30 DIAGNOSIS — K5900 Constipation, unspecified: Secondary | ICD-10-CM | POA: Insufficient documentation

## 2013-03-30 DIAGNOSIS — Z79899 Other long term (current) drug therapy: Secondary | ICD-10-CM | POA: Insufficient documentation

## 2013-03-30 DIAGNOSIS — E860 Dehydration: Secondary | ICD-10-CM | POA: Insufficient documentation

## 2013-03-30 DIAGNOSIS — Z87891 Personal history of nicotine dependence: Secondary | ICD-10-CM | POA: Insufficient documentation

## 2013-03-30 DIAGNOSIS — R339 Retention of urine, unspecified: Secondary | ICD-10-CM | POA: Insufficient documentation

## 2013-03-30 DIAGNOSIS — R5381 Other malaise: Secondary | ICD-10-CM | POA: Insufficient documentation

## 2013-03-30 DIAGNOSIS — R110 Nausea: Secondary | ICD-10-CM | POA: Insufficient documentation

## 2013-03-30 DIAGNOSIS — Z466 Encounter for fitting and adjustment of urinary device: Secondary | ICD-10-CM | POA: Insufficient documentation

## 2013-03-30 DIAGNOSIS — Z9889 Other specified postprocedural states: Secondary | ICD-10-CM | POA: Insufficient documentation

## 2013-03-30 LAB — COMPREHENSIVE METABOLIC PANEL
AST: 27 U/L (ref 0–37)
Albumin: 3.9 g/dL (ref 3.5–5.2)
Alkaline Phosphatase: 78 U/L (ref 39–117)
BUN: 15 mg/dL (ref 6–23)
CO2: 24 mEq/L (ref 19–32)
Chloride: 89 mEq/L — ABNORMAL LOW (ref 96–112)
Creatinine, Ser: 0.85 mg/dL (ref 0.50–1.35)
GFR calc Af Amer: >90 mL/min (ref >90)
GFR calc non Af Amer: 89 mL/min — ABNORMAL LOW (ref >90)
Glucose, Bld: 129 mg/dL — ABNORMAL HIGH (ref 70–99)
Potassium: 4.8 mEq/L (ref 3.5–5.1)
Total Bilirubin: 0.9 mg/dL (ref 0.3–1.2)

## 2013-03-30 LAB — URINALYSIS, ROUTINE W REFLEX MICROSCOPIC
Glucose, UA: NEGATIVE mg/dL
Hgb urine dipstick: NEGATIVE
Ketones, ur: 15 mg/dL — AB
Leukocytes, UA: NEGATIVE
Nitrite: NEGATIVE
Protein, ur: NEGATIVE mg/dL
Urobilinogen, UA: 0.2 mg/dL (ref 0.0–1.0)

## 2013-03-30 LAB — CBC WITH DIFFERENTIAL/PLATELET
Basophils Absolute: 0.1 10*3/uL (ref 0.0–0.1)
Basophils Relative: 1 % (ref 0–1)
Eosinophils Absolute: 0 10*3/uL (ref 0.0–0.7)
HCT: 37.3 % — ABNORMAL LOW (ref 39.0–52.0)
Hemoglobin: 13.5 g/dL (ref 13.0–17.0)
Lymphocytes Relative: 7 % — ABNORMAL LOW (ref 12–46)
Monocytes Relative: 5 % (ref 3–12)
Neutro Abs: 9.5 10*3/uL — ABNORMAL HIGH (ref 1.7–7.7)
Neutrophils Relative %: 87 % — ABNORMAL HIGH (ref 43–77)
RBC: 4.1 MIL/uL — ABNORMAL LOW (ref 4.22–5.81)
RDW: 12 % (ref 11.5–15.5)
WBC: 11 10*3/uL — ABNORMAL HIGH (ref 4.0–10.5)

## 2013-03-30 MED ORDER — HYDROMORPHONE HCL PF 1 MG/ML IJ SOLN
1.0000 mg | Freq: Once | INTRAMUSCULAR | Status: AC
  Administered 2013-03-30: 1 mg via INTRAVENOUS
  Filled 2013-03-30: qty 1

## 2013-03-30 MED ORDER — ONDANSETRON HCL 4 MG/2ML IJ SOLN
4.0000 mg | Freq: Once | INTRAMUSCULAR | Status: AC
  Administered 2013-03-30: 4 mg via INTRAVENOUS
  Filled 2013-03-30: qty 2

## 2013-03-30 MED ORDER — IOHEXOL 300 MG/ML  SOLN
100.0000 mL | Freq: Once | INTRAMUSCULAR | Status: AC | PRN
  Administered 2013-03-30: 100 mL via INTRAVENOUS

## 2013-03-30 MED ORDER — FENTANYL CITRATE 0.05 MG/ML IJ SOLN
100.0000 ug | Freq: Once | INTRAMUSCULAR | Status: AC
  Administered 2013-03-30: 100 ug via INTRAVENOUS
  Filled 2013-03-30: qty 2

## 2013-03-30 MED ORDER — ONDANSETRON HCL 4 MG/2ML IJ SOLN
4.0000 mg | Freq: Once | INTRAMUSCULAR | Status: AC
Start: 2013-03-30 — End: 2013-03-30
  Administered 2013-03-30: 4 mg via INTRAVENOUS
  Filled 2013-03-30: qty 2

## 2013-03-30 MED ORDER — SODIUM CHLORIDE 0.9 % IV BOLUS (SEPSIS): 500.0000 mL | Freq: Once | INTRAVENOUS | Status: DC

## 2013-03-30 MED ORDER — TAMSULOSIN HCL 0.4 MG PO CAPS: 0.4000 mg | ORAL_CAPSULE | Freq: Every day | ORAL | Status: DC

## 2013-03-30 MED ORDER — IOHEXOL 300 MG/ML  SOLN
25.0000 mL | Freq: Once | INTRAMUSCULAR | Status: AC | PRN
  Administered 2013-03-30: 25 mL via ORAL

## 2013-03-30 MED ORDER — SODIUM CHLORIDE 0.9 % IV BOLUS (SEPSIS)
1000.0000 mL | Freq: Once | INTRAVENOUS | Status: AC
  Administered 2013-03-30: 1000 mL via INTRAVENOUS

## 2013-03-30 NOTE — ED Notes (Signed)
MD at bedside. 

## 2013-03-30 NOTE — ED Notes (Signed)
EMS reports pt has recent surgery and was given stool softeners on Thursday.  Pt states he has not had a BM since, and is complaining of BAD pain and a firm rigid abd.

## 2013-03-30 NOTE — Progress Notes (Signed)
Patient ID: Damon Weaver, male   DOB: [birth year], 67 y.o.   MRN: [id number] Pt seen and examined. Here with urinary retention. Much better after foley insertion. No peroneal numbness, no N/T/W in legs. Has occasional brief shooting pains in legs, but the constant pain is gone. Good strength in legs on exam, incision ok. I think this is typical post-op urinary retention seen in men his age after back surgery. No evidence of cauda equina by history or exam. Has f/u scheduled with a urologist. F/u with me in 2 weeks.

## 2013-03-30 NOTE — ED Provider Notes (Signed)
CSN: [id number]     Arrival date & time 03/30/13  4098 History   First MD Initiated Contact with Patient 03/30/13 [phone]     Chief Complaint  Patient presents with  . Abdominal Pain    Patient is a 67 y.o. male presenting with abdominal pain. The history is provided by the patient and the spouse.  Abdominal Pain Pain location:  Generalized Pain severity:  Severe Onset quality:  Gradual Duration:  2 days Timing:  Constant Progression:  Worsening Chronicity:  New Relieved by:  Nothing Worsened by:  Palpation Associated symptoms: constipation and fatigue   Associated symptoms: no chest pain and no dysuria   pt reports recent back surgery (lumbar laminectomy on 10/2) He reports he has not had a BM since the surgery and he is now having abdominal pain  He reports nausea He denies cp/sob No new weakness is reported  Past Medical History  Diagnosis Date  . Hypertension   . Numbness and tingling     Hx: of left leg  . Arthritis    Past Surgical History  Procedure Laterality Date  . Rotator cuff repair      Hx of right arm  . Colonoscopy w/ biopsies and polypectomy      Hx; of  . Lumbar laminectomy/decompression microdiscectomy N/A 03/24/2013    Procedure: LUMBAR LAMINECTOMY/DECOMPRESSION MICRODISCECTOMY LUMBAR THREE-FOUR,FOUR-FIVE;  Surgeon: Tia Alert, MD;  Location: [facility];  Service: Neurosurgery;  Laterality: N/A;   Family History  Problem Relation Age of Onset  . Arthritis Other    History  Substance Use Topics  . Smoking status: Former Smoker    Quit date: 09/12/1981  . Smokeless tobacco: Never Used  . Alcohol Use: Not on file     Comment: daily    Review of Systems  Constitutional: Positive for fatigue.  Cardiovascular: Negative for chest pain.  Gastrointestinal: Positive for abdominal pain and constipation.  Genitourinary: Negative for dysuria.  All other systems reviewed and are negative.    Allergies  Review of patient's allergies indicates no  known allergies.  Home Medications   Current Outpatient Rx  Name  Route  Sig  Dispense  Refill  . lisinopril (PRINIVIL,ZESTRIL) 10 MG tablet   Oral   Take 10 mg by mouth daily.         . Multiple Vitamin (MULTIVITAMIN) tablet   Oral   Take 1 tablet by mouth daily.         Marland Kitchen oxyCODONE-acetaminophen (PERCOCET/ROXICET) 5-325 MG per tablet   Oral   Take 1-2 tablets by mouth every 4 (four) hours as needed.   30 tablet   0   . polyethylene glycol (MIRALAX / GLYCOLAX) packet   Oral   Take 17 g by mouth daily as needed.         . pravastatin (PRAVACHOL) 40 MG tablet   Oral   Take 40 mg by mouth daily.         Marland Kitchen senna (SENOKOT) 8.6 MG tablet   Oral   Take 2 tablets by mouth 2 (two) times daily.          BP 174/87  Pulse 75  Temp(Src) 97.8 F (36.6 C) (Oral)  Resp 21  SpO2 94% Physical Exam CONSTITUTIONAL: Well developed/well nourished, uncomfortable appearing HEAD: Normocephalic/atraumatic EYES: EOMI/PERRL ENMT: Mucous membranes moist NECK: supple no meningeal signs SPINE:well healing incision noted to lumbar region CV: S1/S2 noted, no murmurs/rubs/gallops noted LUNGS: Lungs are clear to auscultation bilaterally, no apparent distress  ABDOMEN: soft, obese.  He has firm area in abdomen just above umbilicus that is very tender to palpation.  No overlying erythema.   GU:no cva tenderness, no hernia and no scrotal tenderness, chaperone present Rectal - stool color brown. Anal tone present, no mass or abscess noted, There is a stool impaction noted.  Chaperone present NEURO: Pt is awake/alert, moves all extremitiesx4 EXTREMITIES: pulses normal, full ROM SKIN: warm, color normal PSYCH: anxious  ED Course  Fecal disimpaction Date/Time: 03/30/2013 8:09 AM Performed by: Joya Gaskins Authorized by: Joya Gaskins Consent: Verbal consent obtained. Patient sedated: no Patient tolerance: Patient tolerated the procedure well with no immediate  complications. Comments: Partial disimpaction attempted Pt tolerated well Nurse chaperone present    Labs Review Labs Reviewed  CBC WITH DIFFERENTIAL - Abnormal; Notable for the following:    WBC 11.0 (*)    RBC 4.10 (*)    HCT 37.3 (*)    MCHC 36.2 (*)    Neutrophils Relative % 87 (*)    Lymphocytes Relative 7 (*)    Neutro Abs 9.5 (*)    All other components within normal limits  COMPREHENSIVE METABOLIC PANEL - Abnormal; Notable for the following:    Sodium 125 (*)    Chloride 89 (*)    Glucose, Bld 129 (*)    GFR calc non Af Amer 89 (*)    All other components within normal limits  URINALYSIS, ROUTINE W REFLEX MICROSCOPIC   7:29 AM Initial concern for bowel obstruction (recent constipation, tenderness above umbilicus and firm mass noted) However ct scan reveals distended bladder Foley catheter ordered 8:08 AM Pt with significant improvement in pain after foley placement  He had of urine in bladder His abdomen is soft, there is no mass noted to abdomen after foley placement He can move his lower extremities without difficulty D/w dr Marikay Alar, his neurosurgeon He feels he can be sent home with foley and urology followup (unlikely cauda equina, this is common postop) Pt/family agreeable with plan Will continue bowel regimen for constipation He was dehydrated but given IV fluids  MDM  No diagnosis found. Nursing notes including past medical history and social history reviewed and considered in documentation Previous records reviewed and considered - recent admission records reviewed Labs/vital reviewed and considered    Date: 03/30/2013  Rate: 67  Rhythm: normal sinus rhythm  QRS Axis: normal  Intervals: normal  ST/T Wave abnormalities: nonspecific ST changes  Conduction Disutrbances:nonspecific IV conduction delay  Narrative Interpretation:   Old EKG Reviewed: unchanged from 03/22/13    Joya Gaskins, MD 03/30/13 [phone]

## 2013-03-31 LAB — URINE CULTURE

## 2013-06-03 ENCOUNTER — Other Ambulatory Visit: Payer: Self-pay | Admitting: Neurological Surgery

## 2013-06-03 DIAGNOSIS — M48061 Spinal stenosis, lumbar region without neurogenic claudication: Secondary | ICD-10-CM

## 2013-06-13 ENCOUNTER — Ambulatory Visit
Admission: RE | Admit: 2013-06-13 | Discharge: 2013-06-13 | Disposition: A | Discharge status: Home / Self Care | Payer: Commercial Managed Care - HMO | Source: Ambulatory Visit | Attending: Neurological Surgery | Admitting: Neurological Surgery

## 2013-06-13 DIAGNOSIS — M48061 Spinal stenosis, lumbar region without neurogenic claudication: Secondary | ICD-10-CM

## 2013-06-13 MED ORDER — GADOBENATE DIMEGLUMINE 529 MG/ML IV SOLN
20.0000 mL | Freq: Once | INTRAVENOUS | Status: AC | PRN
  Administered 2013-06-13: 20 mL via INTRAVENOUS

## 2013-10-03 ENCOUNTER — Other Ambulatory Visit: Payer: Self-pay | Admitting: Neurological Surgery

## 2013-10-03 DIAGNOSIS — M5416 Radiculopathy, lumbar region: Secondary | ICD-10-CM

## 2013-10-06 ENCOUNTER — Ambulatory Visit
Admission: RE | Admit: 2013-10-06 | Discharge: 2013-10-06 | Disposition: A | Discharge status: Home / Self Care | Payer: Commercial Managed Care - HMO | Source: Ambulatory Visit | Attending: Neurological Surgery | Admitting: Neurological Surgery

## 2013-10-06 VITALS — BP 149/92 | HR 101

## 2013-10-06 DIAGNOSIS — M5416 Radiculopathy, lumbar region: Secondary | ICD-10-CM

## 2013-10-06 MED ORDER — ONDANSETRON HCL 4 MG/2ML IJ SOLN
4.0000 mg | Freq: Once | INTRAMUSCULAR | Status: AC
  Administered 2013-10-06: 4 mg via INTRAMUSCULAR

## 2013-10-06 MED ORDER — DIAZEPAM 5 MG PO TABS
5.0000 mg | ORAL_TABLET | Freq: Once | ORAL | Status: AC
  Administered 2013-10-06: 5 mg via ORAL

## 2013-10-06 MED ORDER — IOHEXOL 180 MG/ML  SOLN
15.0000 mL | Freq: Once | INTRAMUSCULAR | Status: AC | PRN
  Administered 2013-10-06: 15 mL via INTRATHECAL

## 2013-10-06 MED ORDER — MEPERIDINE HCL 100 MG/ML IJ SOLN
100.0000 mg | Freq: Once | INTRAMUSCULAR | Status: AC
  Administered 2013-10-06: 100 mg via INTRAMUSCULAR

## 2013-10-06 NOTE — Progress Notes (Signed)
Pt states he has been off tramadol for the past 2 days. Discharge instructions explained to pt. 

## 2013-10-06 NOTE — Discharge Instructions (Signed)
Myelogram Discharge Instructions  1. Go home and rest quietly for the next 24 hours.  It is important to lie flat for the next 24 hours.  Get up only to go to the restroom.  You may lie in the bed or on a couch on your back, your stomach, your left side or your right side.  You may have one pillow under your head.  You may have pillows between your knees while you are on your side or under your knees while you are on your back.  2. DO NOT drive today.  Recline the seat as far back as it will go, while still wearing your seat belt, on the way home.  3. You may get up to go to the bathroom as needed.  You may sit up for 10 minutes to eat.  You may resume your normal diet and medications unless otherwise indicated.  Drink lots of extra fluids today and tomorrow.  4. The incidence of headache, nausea, or vomiting is about 5% (one in 20 patients).  If you develop a headache, lie flat and drink plenty of fluids until the headache goes away.  Caffeinated beverages may be helpful.  If you develop severe nausea and vomiting or a headache that does not go away with flat bed rest, call [phone].  5. You may resume normal activities after your 24 hours of bed rest is over; however, do not exert yourself strongly or do any heavy lifting tomorrow. If when you get up you have a headache when standing, go back to bed and force fluids for another 24 hours.  6. Call your physician for a follow-up appointment.  The results of your myelogram will be sent directly to your physician by the following day.  7. If you have any questions or if complications develop after you arrive home, please call [phone].  Discharge instructions have been explained to the patient.  The patient, or the person responsible for the patient, fully understands these instructions.      Pt may resume Tramadol on October 07, 2013, after 8:30 am.

## 2013-12-08 ENCOUNTER — Other Ambulatory Visit: Payer: Self-pay | Admitting: Family Medicine

## 2013-12-08 ENCOUNTER — Ambulatory Visit
Admission: RE | Admit: 2013-12-08 | Discharge: 2013-12-08 | Disposition: A | Discharge status: Home / Self Care | Payer: Commercial Managed Care - HMO | Source: Ambulatory Visit | Attending: Family Medicine | Admitting: Family Medicine

## 2013-12-08 DIAGNOSIS — R079 Chest pain, unspecified: Secondary | ICD-10-CM

## 2013-12-08 MED ORDER — IOHEXOL 350 MG/ML SOLN
100.0000 mL | Freq: Once | INTRAVENOUS | Status: AC | PRN
  Administered 2013-12-08: 100 mL via INTRAVENOUS

## 2013-12-12 ENCOUNTER — Other Ambulatory Visit: Payer: Self-pay | Admitting: Family Medicine

## 2013-12-12 ENCOUNTER — Encounter: Payer: Self-pay | Admitting: Family Medicine

## 2013-12-12 ENCOUNTER — Ambulatory Visit (INDEPENDENT_AMBULATORY_CARE_PROVIDER_SITE_OTHER)
Admission: RE | Admit: 2013-12-12 | Discharge: 2013-12-12 | Disposition: A | Discharge status: Home / Self Care | Payer: Commercial Managed Care - HMO | Source: Ambulatory Visit | Attending: Family Medicine | Admitting: Family Medicine

## 2013-12-12 ENCOUNTER — Ambulatory Visit (INDEPENDENT_AMBULATORY_CARE_PROVIDER_SITE_OTHER): Payer: Commercial Managed Care - HMO | Admitting: Family Medicine

## 2013-12-12 VITALS — BP 138/84 | HR 99 | Ht 70.0 in | Wt 260.0 lb

## 2013-12-12 DIAGNOSIS — M25551 Pain in right hip: Secondary | ICD-10-CM

## 2013-12-12 DIAGNOSIS — M25559 Pain in unspecified hip: Secondary | ICD-10-CM

## 2013-12-12 DIAGNOSIS — M25552 Pain in left hip: Secondary | ICD-10-CM | POA: Insufficient documentation

## 2013-12-12 NOTE — Assessment & Plan Note (Signed)
Deeply the patient's left leg pain is secondary to osteoporosis a left hip. X-rays are pending today. We discussed over-the-counter medications he can be beneficial as well as an icing regimen. Patient was given home exercise program to improve range of motion at followup will discuss strengthening. We discussed proper shoe wear as well as other activities it can be beneficial. Patient is going to try these interventions and come back again in 2-3 weeks. Depending on the amount of arthritis that is seen on x-ray I would consider doing a ultrasound of the hip as well as a possible intra-articular injection for diagnostic as well as therapeutic purposes.

## 2013-12-12 NOTE — Progress Notes (Signed)
Damon Weaver D.O. [facility] [address] [place], [place] [address] Phone: [phone] Subjective:    I'm seeing this patient by the request  of:  Thora LanceEHINGER,ROBERT R, MD   CC: Low back pain  BJY:[id number]:Subjective Damon Weaver is a 68 y.o. male coming in with complaint of low back pain. Patient does have a past medical history significant for lumbar laminectomy as well as a decompression microdiscectomy the lumbar 3-4, 4-5. The patient continued to have pain even after surgery and has had recent imaging. Patient had a recent myelogram and CT of the lumbar spine Showing retrolisthesis of L3-4 and a bulging herniation of the left side greater than the right. Patient also has a bilateral pars defect of L5-S1 with anterolisthesis. Patient has moderate osseous foraminal narrowing bilaterally at L5-S1. Patient has had multiple epidural steroid injections previously. Patient was taking gabapentin, Percocet, and tramadol for fairly regularly. Patient stopped all medication do because he feels that was not helping. Patient states most of his pain now seems to be localized into the left leg and not as much in his back. Patient states that it seems to be more of a dull chronic aching pain it does not get better with rest anymore. Patient states though that the pain is severe with trying to go up or down stairs. Patient has changed his daily activities secondary to this pain. Patient states that it seems to start in his groin area and can radiate down towards his knee. Most of it seems to be localized in the medial aspect of his leg. Denies any numbness or weakness. States that he can have nighttime awakening. Has tried over-the-counter medications with minimal improvement. Patient is a severity of 8/10.     Past medical history, social, surgical and family history all reviewed in electronic medical record.   Review of Systems: No headache, visual changes, nausea, vomiting, diarrhea, constipation,  dizziness, abdominal pain, skin rash, fevers, chills, night sweats, weight loss, swollen lymph nodes, body aches, joint swelling, muscle aches, chest pain, shortness of breath, mood changes.   Objective There were no vitals taken for this visit.  General: No apparent distress alert and oriented x3 mood and affect normal, dressed appropriately.  HEENT: Pupils equal, extraocular movements intact  Respiratory: Patient's speak in full sentences and does not appear short of breath  Cardiovascular: No lower extremity edema, non tender, no erythema  Skin: Warm dry intact with no signs of infection or rash on extremities or on axial skeleton.  Abdomen: Soft nontender  Neuro: Cranial nerves II through XII are intact, neurovascularly intact in all extremities with 2+ DTRs and 2+ pulses.  Lymph: No lymphadenopathy of posterior or anterior cervical chain or axillae bilaterally.  Gait antalgic gait with patient having left leg externally rotated. MSK:  Non tender with full range of motion and good stability and symmetric strength and tone of shoulders, elbows, wrist,  knee and ankles bilaterally.  Back Exam:  Inspection: Unremarkable  Motion: Flexion 35 deg, Extension 25 deg, Side Bending to 25 deg bilaterally,  Rotation to 25 deg bilaterally  SLR laying: Negative  XSLR laying: Negative  Palpable tenderness: Minimal tenderness over the left SI joint. FABER: Positive. Sensory change: Gross sensation intact to all lumbar and sacral dermatomes.  Reflexes: 2+ at both patellar tendons, 2+ at achilles tendons, Babinski's downgoing.  Strength at foot  Plantar-flexion: 5/5 Dorsi-flexion: 5/5 Eversion: 5/5 Inversion: 5/5  Leg strength  Quad: 5/5 Hamstring: 5/5 Hip flexor: 5/5 Hip abductors: 5/5  Hip: Left ROM IR: 15 Deg, ER: 35 Deg, Flexion: 100 Deg, Extension: 80 Deg, Abduction: 35 Deg, Adduction: 35 Deg Strength IR: 4/5, ER: 5/5, Flexion: 5/5, Extension: 5/5, Abduction: 5/5, Adduction: 5/5 Pelvic  alignment unremarkable to inspection and palpation. Standing hip rotation and gait without trendelenburg sign / unsteadiness. Greater trochanter without tenderness to palpation. No tenderness over piriformis and greater trochanter. +++++ FADIR. Mild left SI joint tenderness and normal minimal SI movement.      Impression and Recommendations:     This case required medical decision making of moderate complexity.

## 2013-12-12 NOTE — Patient Instructions (Signed)
Very nice to meet you.  Ice 20 minutes 2 times daily.  Xrays downstairs today.  Exercises 3 times a week.  Take tylenol 650 mg three times a day is the best evidence based medicine we have for arthritis.  Glucosamine sulfate 750mg  twice a day is a supplement that has been shown to help moderate to severe arthritis. Vitamin D 2000 IU daily Fish oil 2 grams daily.  Tumeric 500mg  twice daily.  Capsaicin topically up to four times a day may also help with pain. Cortisone injections are an option if these interventions do not seem to make a difference or need more relief.  It's important that you continue to stay active. Controlling your weight is important.  Shoe inserts with good arch support may be helpful.  [employer] could help.  Water aerobics and cycling with low resistance are the best two types of exercise for arthritis.  Yoga would be great.  Come back and see me in 2-3 weeks.

## 2013-12-29 ENCOUNTER — Encounter: Payer: Self-pay | Admitting: Family Medicine

## 2013-12-29 ENCOUNTER — Ambulatory Visit (INDEPENDENT_AMBULATORY_CARE_PROVIDER_SITE_OTHER): Payer: Commercial Managed Care - HMO | Admitting: Family Medicine

## 2013-12-29 ENCOUNTER — Other Ambulatory Visit (INDEPENDENT_AMBULATORY_CARE_PROVIDER_SITE_OTHER): Payer: Commercial Managed Care - HMO

## 2013-12-29 VITALS — BP 142/90 | HR 98 | Ht 70.0 in | Wt 262.0 lb

## 2013-12-29 DIAGNOSIS — M25559 Pain in unspecified hip: Secondary | ICD-10-CM

## 2013-12-29 DIAGNOSIS — M25552 Pain in left hip: Secondary | ICD-10-CM

## 2013-12-29 NOTE — Progress Notes (Signed)
Damon ScaleZach Delonna Weaver D.O. [facility] [address] [place], [place] [address] Phone: [phone] Subjective:    CC: Followup left hip pain  BJY:NWGNFAOZHYHPI:Subjective Damon Weaver is a 68 y.o. male coming in with complaint of hip pain. Patient was seen previously and there was a concern the patient actually had more of a radiculopathy that was not improving after surgical intervention as well as medications and epidural steroid injections. Patient was found to have severe ulcerative changes of the left hip though that was likely contributing to his pain. Patient has been doing range of motion exercises as well as the icing and medications with minimal improvement. Patient still states that most the pain seems to be in the groin and seems to be getting somewhat worse over the course of time with increasing activity. Patient states that the nighttime pain has improved which is wonderful but during the day he continues to have trouble which affect his daily activities.   Patient does have a past medical history significant for lumbar laminectomy as well as a decompression microdiscectomy the lumbar 3-4, 4-5. The patient continued to have pain even after surgery and has had recent imaging. Patient had a recent myelogram and CT of the lumbar spine Showing retrolisthesis of L3-4 and a bulging herniation of the left side greater than the right. Patient also has a bilateral pars defect of L5-S1 with anterolisthesis. Patient has moderate osseous foraminal narrowing bilaterally at L5-S1.     Past medical history, social, surgical and family history all reviewed in electronic medical record.   Review of Systems: No headache, visual changes, nausea, vomiting, diarrhea, constipation, dizziness, abdominal pain, skin rash, fevers, chills, night sweats, weight loss, swollen lymph nodes, body aches, joint swelling, muscle aches, chest pain, shortness of breath, mood changes.   Objective Blood pressure 142/90, pulse  98, height 5\' 10"  (1.778 m), weight 262 lb (118.842 kg), SpO2 98.00%.  General: No apparent distress alert and oriented x3 mood and affect normal, dressed appropriately.  HEENT: Pupils equal, extraocular movements intact  Respiratory: Patient's speak in full sentences and does not appear short of breath  Cardiovascular: No lower extremity edema, non tender, no erythema  Skin: Warm dry intact with no signs of infection or rash on extremities or on axial skeleton.  Abdomen: Soft nontender  Neuro: Cranial nerves II through XII are intact, neurovascularly intact in all extremities with 2+ DTRs and 2+ pulses.  Lymph: No lymphadenopathy of posterior or anterior cervical chain or axillae bilaterally.  Gait antalgic gait with patient having left leg externally rotated. MSK:  Non tender with full range of motion and good stability and symmetric strength and tone of shoulders, elbows, wrist,  knee and ankles bilaterally.  Back Exam:  Inspection: Unremarkable  Motion: Flexion 35 deg, Extension 25 deg, Side Bending to 25 deg bilaterally,  Rotation to 25 deg bilaterally  SLR laying: Negative  XSLR laying: Negative  Palpable tenderness: Minimal tenderness over the left SI joint. FABER: Positive. Sensory change: Gross sensation intact to all lumbar and sacral dermatomes.  Reflexes: 2+ at both patellar tendons, 2+ at achilles tendons, Babinski's downgoing.  Strength at foot  Plantar-flexion: 5/5 Dorsi-flexion: 5/5 Eversion: 5/5 Inversion: 5/5  Leg strength  Quad: 5/5 Hamstring: 5/5 Hip flexor: 5/5 Hip abductors: 5/5   Hip: Left ROM IR: 15 Deg with pain ER: 35 Deg, Flexion: 100 Deg, Extension: 80 Deg, Abduction: 35 Deg, Adduction: 35 Deg Strength IR: 4/5, ER: 5/5, Flexion: 5/5, Extension: 5/5, Abduction: 5/5, Adduction: 5/5  Pelvic alignment unremarkable to inspection and palpation. Standing hip rotation and gait without trendelenburg sign / unsteadiness. Greater trochanter without tenderness to  palpation. No tenderness over piriformis and greater trochanter. +++++ FADIR. Mild left SI joint tenderness and normal minimal SI movement  MSK US performed of: Left hip This study was ordered, performed, and interpreted by Terrilee Files D.O.  Hip: Trochanteric bursa without swelling or effusion. Acetabular labrum was significant degenerative changes. Patient does have significant osteoarthritic changes. Femoral neck appears unremarkable without increased power doppler signal along Cortex.  IMPRESSION:  Severe arthritis of the left hip  Procedure: Real-time Ultrasound Guided Injection of left intra-articular hip Device: GE Logiq E  Ultrasound guided injection is preferred based studies that show increased duration, increased effect, greater accuracy, decreased procedural pain, increased response rate with ultrasound guided versus blind injection.  Verbal informed consent obtained.  Time-out conducted.  Noted no overlying erythema, induration, or other signs of local infection.  Skin prepped in a sterile fashion.  Local anesthesia: Topical Ethyl chloride.  With sterile technique and under real time ultrasound guidance:  Anterior capsule visualized, needle visualized going to the head neck junction at the anterior capsule. Pictures taken. Patient did have injection of 3 cc of 1% lidocaine, 3 cc of 0.5% Marcaine, and 1 cc of Kenalog 40 mg/dL. Completed without difficulty  Pain immediately resolved suggesting accurate placement of the medication.  Advised to call if fevers/chills, erythema, induration, drainage, or persistent bleeding.  Images permanently stored and available for review in the ultrasound unit.  Impression: Technically successful ultrasound guided injection.  .      Impression and Recommendations:     This case required medical decision making of moderate complexity.

## 2013-12-29 NOTE — Patient Instructions (Signed)
Good to see you.  Ice 20 minutes two times daily in 6 hours for sure as well.  It is your hip and will need replacement at some point.  We can do injection every 10-12 weeks if needed.  Otherwise Dewaine CongerMurphy  Weiner or Dr. Jerl Santosalldorf would be great.

## 2013-12-29 NOTE — Assessment & Plan Note (Signed)
Patient was given injection as described above. We discussed icing regimen as well as home exercise program. Patient does have significant decrease in pain immediately after injection which let us know that this is the area that is giving him the discomfort. Due to the severity of the osteoarthritic changes of this hip patient would be a candidate for hip replacement surgery. Patient has seen Delbert HarnessMurphy [name] previously and I do think that this patient is considering hip replacement he would be in good hands. Patient though did have good relief of pain with the injection and may want to do these every 3-4 months. Patient will see how long this last and will check in with us again in 4-6 weeks for further evaluation. In the interim they'll continue with the conservative therapy including medications, home exercises and icing.

## 2014-05-31 NOTE — H&P (Signed)
  [name] [address]   [address] [place], [place] [id number] [phone] A Division of [facility]  Loreta Aveaniel F. Mance Vallejo, M.D.   Robert A. Thurston HoleWainer, M.D.   Burnell BlanksW. Dan Caffrey, M.D.   Eulas PostJoshua P. Landau, M.D.   Lunette StandsAnna Voytek, M.D Jewel Baizeimothy D. Eulah PontMurphy, M.D.  Buford DresserWesley R. Ibazebo, M.D.  Estell HarpinJames S. Kramer, M.D.    Melina Fiddlerebecca S. Bassett, M.D. Mary L. Isidoro DonningAnton, PA-C  Kirstin A. Shepperson, PA-C  Josh Pikevillehadwell, PA-C Harris HillBrandon Parry, [place]A-C   RE: Mickie KayBastian, Hridhaan   [phone]      DOB: 02/03/1946 PROGRESS NOTE: 01-25-14 Reason for visit:  Left hip pain. History of present illness: Pleasant 68 year old gentleman with atraumatic onset of left hip arthritis. He has had pain in the groin for several years. He has done herbal supplements Tylenol and recently had a hip injection which has been helping but he knows it will wear off at some point. He had some lumbar stenosis in the past treated with surgical decompression and he knows that pain and it is very different from his current pain.    Please see associated documentation for this clinic visit for further past medical, family, surgical and social history, review of systems, and exam findings as this was reviewed by me.  EXAMINATION: Well appearing male in no apparent distress. Skin is benign, neurovascularly intact. Good pulses. Limited hip range of motion but not severely painful at this time.  IMAGING: Outside x-rays reviewed by me demonstrate severe left hip arthritis, no other abnormality.   ASSESSMENT/PLAN: 1. He has severe left hip arthritis. 2. I talked to him about all his options, we could repeat an injection his last one was a month ago so it would need to be 2-3 months from now, he can add NSAID's to his treatment and I did discuss the risks and benefits of total hip arthroplasty with him. 3. He will call me when he decides what he would like to do next and I will give him all surgical information as  well.   Jewel Baizeimothy D.  Eulah PontMurphy, M.D.  Electronically verified by Jewel Baizeimothy D. Eulah PontMurphy, M.D. TDM:kah D 01-25-14 T 01-26-14

## 2014-06-02 ENCOUNTER — Ambulatory Visit (INDEPENDENT_AMBULATORY_CARE_PROVIDER_SITE_OTHER): Payer: Commercial Managed Care - HMO | Admitting: Cardiovascular Disease

## 2014-06-02 ENCOUNTER — Encounter: Payer: Self-pay | Admitting: Cardiovascular Disease

## 2014-06-02 VITALS — BP 146/100 | HR 98 | Ht 70.0 in | Wt 236.8 lb

## 2014-06-02 DIAGNOSIS — I4819 Other persistent atrial fibrillation: Secondary | ICD-10-CM | POA: Insufficient documentation

## 2014-06-02 DIAGNOSIS — I482 Chronic atrial fibrillation, unspecified: Secondary | ICD-10-CM

## 2014-06-02 DIAGNOSIS — I10 Essential (primary) hypertension: Secondary | ICD-10-CM

## 2014-06-02 LAB — CBC WITH DIFFERENTIAL/PLATELET
BASOS ABS: 0 10*3/uL (ref 0.0–0.1)
BASOS PCT: 0 % (ref 0–1)
Eosinophils Absolute: 0.1 10*3/uL (ref 0.0–0.7)
Eosinophils Relative: 1 % (ref 0–5)
HCT: 40.1 % (ref 39.0–52.0)
Hemoglobin: 14.2 g/dL (ref 13.0–17.0)
Lymphocytes Relative: 13 % (ref 12–46)
Lymphs Abs: 1 10*3/uL (ref 0.7–4.0)
MCH: 32.6 pg (ref 26.0–34.0)
MCHC: 35.4 g/dL (ref 30.0–36.0)
MCV: 92.2 fL (ref 78.0–100.0)
MONOS PCT: 10 % (ref 3–12)
MPV: 10.1 fL (ref 9.4–12.4)
Monocytes Absolute: 0.8 10*3/uL (ref 0.1–1.0)
NEUTROS ABS: 5.7 10*3/uL (ref 1.7–7.7)
NEUTROS PCT: 76 % (ref 43–77)
PLATELETS: 201 10*3/uL (ref 150–400)
RBC: 4.35 MIL/uL (ref 4.22–5.81)
RDW: 13.2 % (ref 11.5–15.5)
WBC: 7.5 10*3/uL (ref 4.0–10.5)

## 2014-06-02 MED ORDER — CARVEDILOL 6.25 MG PO TABS: 6.2500 mg | ORAL_TABLET | Freq: Two times a day (BID) | ORAL | Status: DC

## 2014-06-02 MED ORDER — APIXABAN 5 MG PO TABS: 5.0000 mg | ORAL_TABLET | Freq: Two times a day (BID) | ORAL | Status: DC

## 2014-06-02 NOTE — Patient Instructions (Addendum)
Check the price of the following anticoagulants:  Pradaxa 150 mg twice a day Xarelto 20 mg a day Eliquis 5 mg twice a day Savaysa 60 mg a day.  Your physician has requested that you have an echocardiogram. Echocardiography is a painless test that uses sound waves to create images of your heart. It provides your doctor with information about the size and shape of your heart and how well your heart's chambers and valves are working. This procedure takes approximately one hour. There are no restrictions for this procedure.  Your physician has recommended you make the following change in your medication:  START Eliquis 5 mg twice daily START Coreg (Carvedilol) 6.25 mg twice daily - take 12 hours apart  Your physician recommends that you have lab work:  TODAY - CBC, BMET

## 2014-06-02 NOTE — Assessment & Plan Note (Signed)
His blood pressure is a little elevated. We then his carvedilol to 6.25 mg twice a day. See him again in several weeks for followup visit prior to his hip surgery.

## 2014-06-02 NOTE — Progress Notes (Signed)
Damon Weaver Date of Birth  10/13/1945       [facility]    [place] [facility] [address]  [address] [place], [place]  [address]   [place], [place]  [id number] [phone]     [phone]   Fax  [phone]     Fax [phone]  Problem List: 1. Atrial fib 2. Hypertension 3. Hyperlipidemia    History of Present Illness:  Damon Weaver a 68 yo who is referred by Dr. Manus GunningEhinger for evaluation of atria fib. He was incidentally noted to have atrial fibrillation at the time of this reactive EKG prior to hip replacement.  Damon CoombsDon is  completely asymptomatic.  He  never had any episodes of chest pain or shortness breath. He does not get any regular exercise.   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   He used to be very active  - [place]  He used to walk with his wife but has been limited by spinal stenosis.    He can still several miles without problems. No leg swelling, no leg.    No hx of GI bleeding.   Drinks 2 glasses of wine a day ( typically red blends) Non smoker - quit at age 68 FHx- mother had a CVA at age 66-60,   mother is 8691 now , father died at 6375 of brain tumor.     Current Outpatient Prescriptions on File Prior to Visit  Medication Sig Dispense Refill  . lisinopril (PRINIVIL,ZESTRIL) 10 MG tablet Take 10 mg by mouth daily.    . Multiple Vitamin (MULTIVITAMIN) tablet Take 1 tablet by mouth daily.    . pravastatin (PRAVACHOL) 40 MG tablet Take 40 mg by mouth daily.     No current facility-administered medications on file prior to visit.    No Known Allergies  Past Medical History  Diagnosis Date  . Hypertension   . Numbness and tingling     Hx: of left leg  . Arthritis     Past Surgical History  Procedure Laterality Date  . Rotator cuff repair      Hx of right arm  . Colonoscopy w/ biopsies and polypectomy      Hx; of  . Lumbar  laminectomy/decompression microdiscectomy N/A 03/24/2013    Procedure: LUMBAR LAMINECTOMY/DECOMPRESSION MICRODISCECTOMY LUMBAR THREE-FOUR,FOUR-FIVE;  Surgeon: [name], MD;  Location: [facility];  Service: Neurosurgery;  Laterality: N/A;    History  Smoking status  . Former Smoker  . Quit date: 09/12/1981  Smokeless tobacco  . Never Used    History  Alcohol Use: Not on file    Comment: daily    Family History  Problem Relation Age of Onset  . Arthritis Other     Reviw of Systems:  Reviewed in the HPI.  All other systems are negative.  Physical Exam: Blood pressure 146/100, pulse 98, height 5\' 10"  (1.778 m), weight 236 lb 12.8 oz (107.412 kg). Wt Readings from Last 3 Encounters:  06/02/14 236 lb 12.8 oz (107.412 kg)  12/29/13 262 lb (118.842 kg)  12/12/13 260 lb (117.935 kg)     General: Well developed, well nourished, in no acute distress.  Head: Normocephalic, atraumatic, sclera non-icteric, mucus membranes are moist,   Neck: Supple. Carotids are 2 + without bruits. No JVD   Lungs: Clear   Heart: Irreg. Irreg. Normal S1S2  Abdomen: Soft, non-tender, non-distended with  normal bowel sounds.  Msk:  Strength and tone are normal   Extremities: No clubbing or cyanosis. No edema.  Distal pedal pulses are 2+ and equal    Neuro: CN II - XII intact.  Alert and oriented X 3.   Psych:  Normal   ECG: Dec, 11, 2015:  Atrial fib with rate of 98.    Assessment / Plan:

## 2014-06-02 NOTE — Assessment & Plan Note (Addendum)
Damon Weaver presents today for preop visit for atrial fibrillation. He has had atrial fibrillation for at least 6 months.  He is completely asymptomatic. His rate is a little high although it's fairly well controlled. He denies any chest pain, syncope, shortness of breath.   he has a fairly complex issue in that he needs to be started on anticoagulation short term but then will need to hold the anticoagulation prior to his surgery.    We had a Discussion about the risk of stroke. His CHADS VASC2 score is 2 ( age, HTN)   We will start him on Eliquis 5 mg  BID.  He will need to hold this for 2 days prior to his  hip surgery We will add Coreg 6. 25 BID to help control his BP and his HR.    i will see him on Dec. 30 for follow up visit.  He is completely asymptomatic and I think that our strategy will be rate control and anticoatulation without necessarily trying to convert him to NSR.

## 2014-06-03 LAB — BASIC METABOLIC PANEL
BUN: 13 mg/dL (ref 6–23)
CALCIUM: 9.6 mg/dL (ref 8.4–10.5)
CO2: 26 mEq/L (ref 19–32)
Chloride: 96 mEq/L (ref 96–112)
Creat: 0.89 mg/dL (ref 0.50–1.35)
GLUCOSE: 74 mg/dL (ref 70–99)
POTASSIUM: 4.8 meq/L (ref 3.5–5.3)
SODIUM: 131 meq/L — AB (ref 135–145)

## 2014-06-06 ENCOUNTER — Telehealth: Payer: Self-pay | Admitting: *Deleted

## 2014-06-06 NOTE — Telephone Encounter (Signed)
Faxed cardiac clearance for [name] for left total hip replacement in January 2016, per Dr. Elease HashimotoNahser.  Paperwork instructed to hold Eliquis 2 days prior to hip surgery.

## 2014-06-07 ENCOUNTER — Ambulatory Visit (HOSPITAL_COMMUNITY): Payer: Commercial Managed Care - HMO | Attending: Cardiology

## 2014-06-07 DIAGNOSIS — I4891 Unspecified atrial fibrillation: Secondary | ICD-10-CM

## 2014-06-07 DIAGNOSIS — Z87891 Personal history of nicotine dependence: Secondary | ICD-10-CM | POA: Insufficient documentation

## 2014-06-07 DIAGNOSIS — I10 Essential (primary) hypertension: Secondary | ICD-10-CM | POA: Insufficient documentation

## 2014-06-07 DIAGNOSIS — I482 Chronic atrial fibrillation, unspecified: Secondary | ICD-10-CM

## 2014-06-07 NOTE — Progress Notes (Signed)
2D Echo completed. 06/07/2014 

## 2014-06-13 NOTE — Pre-Procedure Instructions (Signed)
Damon Weaver  06/13/2014   Your procedure is scheduled on:  Monday, January 5th  [facility] at 530 AM.  Call this number if you have problems the morning of surgery: [phone]   Remember:   Do not eat food or drink liquids after midnight.   Take these medicines the morning of surgery with A SIP OF WATER: coreg, tylenol if needed  Stop eliquis as instructed by your MD.   Do not wear jewelry.  Do not wear lotions, powders, or perfumes, deodorant.  Do not shave 48 hours prior to surgery. Men may shave face and neck.  Do not bring valuables to the hospital.  [facility] is not responsible  for any belongings or valuables.               Contacts, dentures or bridgework may not be worn into surgery.  Leave suitcase in the car. After surgery it may be brought to your room.  For patients admitted to the hospital, discharge time is determined by your  treatment team.               Please read over the following fact sheets that you were given: Pain Booklet, Coughing and Deep Breathing, Blood Transfusion Information, MRSA Information and Surgical Site Infection Prevention  [facility] - Preparing for Surgery  Before surgery, you can play an important role.  Because skin is not sterile, your skin needs to be as free of germs as possible.  You can reduce the number of germs on you skin by washing with CHG (chlorahexidine gluconate) soap before surgery.  CHG is an antiseptic cleaner which kills germs and bonds with the skin to continue killing germs even after washing.  Please DO NOT use if you have an allergy to CHG or antibacterial soaps.  If your skin becomes reddened/irritated stop using the CHG and inform your nurse when you arrive at Short Stay.  Do not shave (including legs and underarms) for at least 48 hours prior to the first CHG shower.  You may shave your face.  Please follow these instructions carefully:   1.  Shower with CHG Soap the night  before surgery and the morning of Surgery.  2.  If you choose to wash your hair, wash your hair first as usual with your normal shampoo.  3.  After you shampoo, rinse your hair and body thoroughly to remove the shampoo.  4.  Use CHG as you would any other liquid soap.  You can apply CHG directly to the skin and wash gently with scrungie or a clean washcloth.  5.  Apply the CHG Soap to your body ONLY FROM THE NECK DOWN.  Do not use on open wounds or open sores.  Avoid contact with your eyes, ears, mouth and genitals (private parts).  Wash genitals (private parts) with your normal soap.  6.  Wash thoroughly, paying special attention to the area where your surgery will be performed.  7.  Thoroughly rinse your body with warm water from the neck down.  8.  DO NOT shower/wash with your normal soap after using and rinsing off the CHG Soap.  9.  Pat yourself dry with a clean towel.            10.  Wear clean pajamas.            11.  Place clean sheets on your bed the night of your first shower and do not sleep with  pets.  Day of Surgery  Do not apply any lotions/deoderants the morning of surgery.  Please wear clean clothes to the hospital/surgery center.

## 2014-06-14 ENCOUNTER — Encounter (HOSPITAL_COMMUNITY): Payer: Self-pay

## 2014-06-14 ENCOUNTER — Encounter (HOSPITAL_COMMUNITY)
Admission: RE | Admit: 2014-06-14 | Discharge: 2014-06-14 | Disposition: A | Discharge status: Home / Self Care | Payer: Commercial Managed Care - HMO | Source: Ambulatory Visit | Attending: Orthopedic Surgery | Admitting: Orthopedic Surgery

## 2014-06-14 DIAGNOSIS — Z01812 Encounter for preprocedural laboratory examination: Secondary | ICD-10-CM | POA: Diagnosis present

## 2014-06-14 HISTORY — DX: Other seasonal allergic rhinitis: J30.2

## 2014-06-14 HISTORY — DX: Cardiac arrhythmia, unspecified: I49.9

## 2014-06-14 LAB — URINALYSIS, ROUTINE W REFLEX MICROSCOPIC
Bilirubin Urine: NEGATIVE
Glucose, UA: NEGATIVE mg/dL
Hgb urine dipstick: NEGATIVE
Ketones, ur: NEGATIVE mg/dL
Leukocytes, UA: NEGATIVE
Nitrite: NEGATIVE
Protein, ur: NEGATIVE mg/dL
Specific Gravity, Urine: 1.019 (ref 1.005–1.030)
Urobilinogen, UA: 1 mg/dL (ref 0.0–1.0)
pH: 7.5 (ref 5.0–8.0)

## 2014-06-14 LAB — TYPE AND SCREEN
ABO/RH(D): O POS
ANTIBODY SCREEN: NEGATIVE

## 2014-06-14 LAB — CBC
HEMATOCRIT: 41.4 % (ref 39.0–52.0)
HEMOGLOBIN: 14.4 g/dL (ref 13.0–17.0)
MCH: 32.8 pg (ref 26.0–34.0)
MCHC: 34.8 g/dL (ref 30.0–36.0)
MCV: 94.3 fL (ref 78.0–100.0)
PLATELETS: 178 10*3/uL (ref 150–400)
RBC: 4.39 MIL/uL (ref 4.22–5.81)
RDW: 12.5 % (ref 11.5–15.5)
WBC: 5.8 10*3/uL (ref 4.0–10.5)

## 2014-06-14 LAB — SURGICAL PCR SCREEN
MRSA, PCR: NEGATIVE
Staphylococcus aureus: POSITIVE — AB

## 2014-06-14 LAB — BASIC METABOLIC PANEL WITH GFR
Anion gap: 8 (ref 5–15)
BUN: 12 mg/dL (ref 6–23)
CO2: 28 mmol/L (ref 19–32)
Calcium: 9.8 mg/dL (ref 8.4–10.5)
Chloride: 98 meq/L (ref 96–112)
Creatinine, Ser: 0.91 mg/dL (ref 0.50–1.35)
GFR calc Af Amer: >90 mL/min
GFR calc non Af Amer: 86 mL/min — ABNORMAL LOW
Glucose, Bld: 104 mg/dL — ABNORMAL HIGH (ref 70–99)
Potassium: 5.2 mmol/L — ABNORMAL HIGH (ref 3.5–5.1)
Sodium: 134 mmol/L — ABNORMAL LOW (ref 135–145)

## 2014-06-14 LAB — ABO/RH: ABO/RH(D): O POS

## 2014-06-14 LAB — PROTIME-INR
INR: 1.26 (ref 0.00–1.49)
Prothrombin Time: 15.9 seconds — ABNORMAL HIGH (ref 11.6–15.2)

## 2014-06-14 MED ORDER — CHLORHEXIDINE GLUCONATE 4 % EX LIQD: 60.0000 mL | Freq: Once | CUTANEOUS | Status: DC

## 2014-06-14 NOTE — Progress Notes (Signed)
Patient have an appointment to be seen on 12/30/ 15 with Dr Elease HashimotoNahser . Patient stated  Eliquis will be addressed again. Requested cardiac clearance from Dr Wandra Feinstein. Murphy that was sent by Dr Harvie BridgeNahser's office

## 2014-06-14 NOTE — Progress Notes (Signed)
Patient made aware that nasal swab was positive for staph, script was called to pt's pharmacy at [phone]. Patient verbalized understanding.

## 2014-06-15 NOTE — Consult Note (Signed)
Anesthesiology Chart Review:  68 year old male for L. THR 06/27/14. He has chronic  atrial fibrillation treated with rate control on eliquis. Seen by Nahser 06/02/14  for cardiac clearance. No cardiac symptoms. EF 55% by Echo 06/07/14. Labs OK, needs to stop eliquis 5 days prior to surgery.  Damon Weaver

## 2014-06-16 LAB — URINE CULTURE: Colony Count: 30000

## 2014-06-21 ENCOUNTER — Encounter: Payer: Self-pay | Admitting: Cardiovascular Disease

## 2014-06-21 ENCOUNTER — Ambulatory Visit (INDEPENDENT_AMBULATORY_CARE_PROVIDER_SITE_OTHER): Payer: Commercial Managed Care - HMO | Admitting: Cardiovascular Disease

## 2014-06-21 VITALS — BP 130/96 | HR 99 | Ht 69.5 in | Wt 237.0 lb

## 2014-06-21 DIAGNOSIS — I482 Chronic atrial fibrillation, unspecified: Secondary | ICD-10-CM

## 2014-06-21 MED ORDER — RIVAROXABAN 20 MG PO TABS: 20.0000 mg | ORAL_TABLET | Freq: Every day | ORAL | Status: DC

## 2014-06-21 MED ORDER — CARVEDILOL 12.5 MG PO TABS: 12.5000 mg | ORAL_TABLET | Freq: Two times a day (BID) | ORAL | Status: DC

## 2014-06-21 NOTE — Progress Notes (Signed)
Damon Weaver Date of Birth  11/26/1945       [facility]    [place] [facility] [address]  [address] [place], [place]  [address]   [place], [place]  [address] [phone]     [phone]   Fax  [phone]     Fax [phone]  Problem List: 1. Atrial fib 2. Hypertension 3. Hyperlipidemia    History of Present Illness:  Damon Weaver a 68 yo who is referred by Dr. Manus GunningEhinger for evaluation of atria fib. He was incidentally noted to have atrial fibrillation at the time of this reactive EKG prior to hip replacement.  Damon CoombsDon is  completely asymptomatic.  He  never had any episodes of chest pain or shortness breath. He does not get any regular exercise.   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   He used to be very active  - [place]  He used to walk with his wife but has been limited by spinal stenosis.    He can still several miles without problems. No leg swelling, no leg.    No hx of GI bleeding.   Drinks 2 glasses of wine a day ( typically red blends) Non smoker - quit at age 89 FHx- mother had a CVA at age 66-60,   mother is 4991 now , father died at 4075 of brain tumor.  Dec. 30, 2015: Damon CoombsDon is doing well.  Is concerned that his HR is too high. Has not been doing any exercise for about 3 years due to orthopedic injuries.     Current Outpatient Prescriptions on File Prior to Visit  Medication Sig Dispense Refill  . acetaminophen (TYLENOL) 325 MG tablet Take 650 mg by mouth every 6 (six) hours as needed.    Marland Kitchen. apixaban (ELIQUIS) 5 MG TABS tablet Take 1 tablet (5 mg total) by mouth 2 (two) times daily. 60 tablet 11  . carvedilol (COREG) 6.25 MG tablet Take 1 tablet (6.25 mg total) by mouth 2 (two) times daily. 180 tablet 3  . lisinopril (PRINIVIL,ZESTRIL) 10 MG tablet Take 10 mg by mouth daily.    . Multiple Vitamin (MULTIVITAMIN) tablet Take 1 tablet by mouth daily.    .  pravastatin (PRAVACHOL) 40 MG tablet Take 40 mg by mouth daily.     No current facility-administered medications on file prior to visit.    No Known Allergies  Past Medical History  Diagnosis Date  . Hypertension   . Numbness and tingling     Hx: of left leg  . Arthritis   . Dysrhythmia     Afib  . Seasonal allergies     Past Surgical History  Procedure Laterality Date  . Rotator cuff repair      Hx of right arm  . Colonoscopy w/ biopsies and polypectomy      Hx; of  . Lumbar laminectomy/decompression microdiscectomy N/A 03/24/2013    Procedure: LUMBAR LAMINECTOMY/DECOMPRESSION MICRODISCECTOMY LUMBAR THREE-FOUR,FOUR-FIVE;  Surgeon: [name], MD;  Location: [facility];  Service: Neurosurgery;  Laterality: N/A;    History  Smoking status  . Former Smoker -- 2.00 packs/day for 16 years  . Quit date: 09/12/1981  Smokeless tobacco  . Never Used    History  Alcohol Use  . Yes    Comment: daily    Family History  Problem Relation Age of Onset  . Arthritis Other  Reviw of Systems:  Reviewed in the HPI.  All other systems are negative.  Physical Exam: Blood pressure 130/96, pulse 99, height 5' 9.5" (1.765 m), weight 237 lb (107.502 kg), SpO2 99 %. Wt Readings from Last 3 Encounters:  06/21/14 237 lb (107.502 kg)  06/14/14 233 lb (105.688 kg)  06/02/14 236 lb 12.8 oz (107.412 kg)     General: Well developed, well nourished, in no acute distress.  Head: Normocephalic, atraumatic, sclera non-icteric, mucus membranes are moist,   Neck: Supple. Carotids are 2 + without bruits. No JVD   Lungs: Clear   Heart: Irreg. Irreg. Normal S1S2  Abdomen: Soft, non-tender, non-distended with normal bowel sounds.  Msk:  Strength and tone are normal   Extremities: No clubbing or cyanosis. No edema.  Distal pedal pulses are 2+ and equal    Neuro: CN II - XII intact.  Alert and oriented X 3.   Psych:  Normal   ECG:   Assessment / Plan:

## 2014-06-21 NOTE — Patient Instructions (Signed)
Your physician has recommended you make the following change in your medication:  1) INCREASE Coreg to 12.5 mg twice a day 2) STOP Elliquis as of 06/24/2014 3) START Xarelto 20 mg on 06/28/2014  Your physician recommends that you schedule a follow-up appointment in: 1 month with Dr. Elease HashimotoNahser.

## 2014-06-21 NOTE — Assessment & Plan Note (Addendum)
Damon Weaver has persistent atrial fibrillation. He is basically asymptomatic. He has developed a drug rash on his face and upper chest. It is not clear whether this is due to carvedilol or the Eliquis  He will be stopping the Eliquis on Saturday for his his surgery on Tuesday. Will DC eliquis at that time and will restart him on Xarelto 20 mg a day - starting whenever ortho gives the OK - probably Jan 6.  Will increase coreg to 12. 5 BID to help with rate control  I will see him in 1 month

## 2014-06-26 MED ORDER — ACETAMINOPHEN 500 MG PO TABS
1000.0000 mg | ORAL_TABLET | Freq: Once | ORAL | Status: AC
  Administered 2014-06-27: 1000 mg via ORAL
  Filled 2014-06-26: qty 2

## 2014-06-26 MED ORDER — DEXTROSE-NACL 5-0.45 % IV SOLN: 100.0000 mL/h | INTRAVENOUS | Status: DC

## 2014-06-26 MED ORDER — SODIUM CHLORIDE 0.9 % IV SOLN
1000.0000 mg | INTRAVENOUS | Status: AC
  Administered 2014-06-27: 1000 mg via INTRAVENOUS
  Filled 2014-06-26: qty 10

## 2014-06-26 MED ORDER — CEFAZOLIN SODIUM-DEXTROSE 2-3 GM-% IV SOLR
2.0000 g | INTRAVENOUS | Status: AC
  Administered 2014-06-27: 2 g via INTRAVENOUS
  Filled 2014-06-26: qty 50

## 2014-06-27 ENCOUNTER — Inpatient Hospital Stay (HOSPITAL_COMMUNITY)
Admission: RE | Admit: 2014-06-27 | Discharge: 2014-06-28 | DRG: 470 | Disposition: A | Discharge status: Home / Self Care | Payer: Commercial Managed Care - HMO | Source: Ambulatory Visit | Attending: Orthopedic Surgery | Admitting: Orthopedic Surgery

## 2014-06-27 ENCOUNTER — Inpatient Hospital Stay (HOSPITAL_COMMUNITY): Payer: Commercial Managed Care - HMO | Admitting: Anesthesiology

## 2014-06-27 ENCOUNTER — Encounter (HOSPITAL_COMMUNITY)
Admission: RE | Disposition: A | Discharge status: Home / Self Care | Payer: Self-pay | Source: Ambulatory Visit | Attending: Orthopedic Surgery

## 2014-06-27 ENCOUNTER — Encounter (HOSPITAL_COMMUNITY): Payer: Self-pay | Admitting: *Deleted

## 2014-06-27 ENCOUNTER — Inpatient Hospital Stay (HOSPITAL_COMMUNITY): Payer: Commercial Managed Care - HMO

## 2014-06-27 DIAGNOSIS — I10 Essential (primary) hypertension: Secondary | ICD-10-CM | POA: Diagnosis present

## 2014-06-27 DIAGNOSIS — I482 Chronic atrial fibrillation: Secondary | ICD-10-CM | POA: Diagnosis present

## 2014-06-27 DIAGNOSIS — Z7901 Long term (current) use of anticoagulants: Secondary | ICD-10-CM | POA: Diagnosis not present

## 2014-06-27 DIAGNOSIS — M1612 Unilateral primary osteoarthritis, left hip: Secondary | ICD-10-CM | POA: Diagnosis not present

## 2014-06-27 DIAGNOSIS — Z9889 Other specified postprocedural states: Secondary | ICD-10-CM

## 2014-06-27 DIAGNOSIS — Z96642 Presence of left artificial hip joint: Secondary | ICD-10-CM | POA: Diagnosis not present

## 2014-06-27 DIAGNOSIS — Z471 Aftercare following joint replacement surgery: Secondary | ICD-10-CM | POA: Diagnosis not present

## 2014-06-27 DIAGNOSIS — M25552 Pain in left hip: Secondary | ICD-10-CM | POA: Diagnosis present

## 2014-06-27 DIAGNOSIS — M169 Osteoarthritis of hip, unspecified: Secondary | ICD-10-CM | POA: Diagnosis not present

## 2014-06-27 DIAGNOSIS — M1990 Unspecified osteoarthritis, unspecified site: Secondary | ICD-10-CM | POA: Diagnosis present

## 2014-06-27 HISTORY — PX: TOTAL HIP ARTHROPLASTY: SHX124

## 2014-06-27 SURGERY — ARTHROPLASTY, HIP, TOTAL, ANTERIOR APPROACH
Anesthesia: General | Site: Hip | Laterality: Left

## 2014-06-27 MED ORDER — PROPOFOL 10 MG/ML IV BOLUS
INTRAVENOUS | Status: AC
  Filled 2014-06-27: qty 20

## 2014-06-27 MED ORDER — CELECOXIB 200 MG PO CAPS
200.0000 mg | ORAL_CAPSULE | Freq: Two times a day (BID) | ORAL | Status: DC
  Administered 2014-06-27 – 2014-06-28 (×2): 200 mg via ORAL
  Filled 2014-06-27 (×3): qty 1

## 2014-06-27 MED ORDER — DEXAMETHASONE SODIUM PHOSPHATE 10 MG/ML IJ SOLN
10.0000 mg | Freq: Once | INTRAMUSCULAR | Status: AC
  Administered 2014-06-28: 10 mg via INTRAVENOUS
  Filled 2014-06-27: qty 1

## 2014-06-27 MED ORDER — GLYCOPYRROLATE 0.2 MG/ML IJ SOLN
INTRAMUSCULAR | Status: AC
Start: 2014-06-27 — End: 2014-06-27
  Filled 2014-06-27: qty 3

## 2014-06-27 MED ORDER — RIVAROXABAN 20 MG PO TABS
20.0000 mg | ORAL_TABLET | Freq: Every day | ORAL | Status: DC
  Administered 2014-06-27: 20 mg via ORAL
  Filled 2014-06-27 (×2): qty 1

## 2014-06-27 MED ORDER — METOCLOPRAMIDE HCL 5 MG/ML IJ SOLN
5.0000 mg | Freq: Three times a day (TID) | INTRAMUSCULAR | Status: DC | PRN
Start: 2014-06-27 — End: 2014-06-28

## 2014-06-27 MED ORDER — HYDROMORPHONE HCL 1 MG/ML IJ SOLN
INTRAMUSCULAR | Status: AC
  Filled 2014-06-27: qty 1

## 2014-06-27 MED ORDER — LACTATED RINGERS IV SOLN
INTRAVENOUS | Status: DC | PRN
Start: 2014-06-27 — End: 2014-06-27
  Administered 2014-06-27 (×3): via INTRAVENOUS

## 2014-06-27 MED ORDER — LIDOCAINE HCL (CARDIAC) 20 MG/ML IV SOLN
INTRAVENOUS | Status: AC
  Filled 2014-06-27: qty 5

## 2014-06-27 MED ORDER — LIDOCAINE HCL (CARDIAC) 20 MG/ML IV SOLN
INTRAVENOUS | Status: DC | PRN
  Administered 2014-06-27: 100 mg via INTRAVENOUS

## 2014-06-27 MED ORDER — SODIUM CHLORIDE 0.9 % IR SOLN
Status: DC | PRN
  Administered 2014-06-27: 1000 mL

## 2014-06-27 MED ORDER — ONDANSETRON HCL 4 MG PO TABS: 4.0000 mg | ORAL_TABLET | Freq: Four times a day (QID) | ORAL | Status: DC | PRN

## 2014-06-27 MED ORDER — NEOSTIGMINE METHYLSULFATE 10 MG/10ML IV SOLN
INTRAVENOUS | Status: DC | PRN
  Administered 2014-06-27: 4 mg via INTRAVENOUS

## 2014-06-27 MED ORDER — MORPHINE SULFATE 2 MG/ML IJ SOLN
2.0000 mg | INTRAMUSCULAR | Status: DC | PRN
  Administered 2014-06-27: 2 mg via INTRAVENOUS
  Filled 2014-06-27: qty 1

## 2014-06-27 MED ORDER — ACETAMINOPHEN 650 MG RE SUPP: 650.0000 mg | Freq: Four times a day (QID) | RECTAL | Status: DC | PRN

## 2014-06-27 MED ORDER — PROPOFOL 10 MG/ML IV BOLUS
INTRAVENOUS | Status: AC
Start: 2014-06-27 — End: 2014-06-27
  Filled 2014-06-27: qty 20

## 2014-06-27 MED ORDER — DOCUSATE SODIUM 100 MG PO CAPS
100.0000 mg | ORAL_CAPSULE | Freq: Two times a day (BID) | ORAL | Status: DC
  Administered 2014-06-28: 100 mg via ORAL
  Filled 2014-06-27 (×2): qty 1

## 2014-06-27 MED ORDER — GLYCOPYRROLATE 0.2 MG/ML IJ SOLN
INTRAMUSCULAR | Status: DC | PRN
  Administered 2014-06-27: 0.6 mg via INTRAVENOUS

## 2014-06-27 MED ORDER — ONDANSETRON HCL 4 MG/2ML IJ SOLN
INTRAMUSCULAR | Status: AC
Start: 2014-06-27 — End: 2014-06-27
  Filled 2014-06-27: qty 2

## 2014-06-27 MED ORDER — BUPIVACAINE LIPOSOME 1.3 % IJ SUSP
20.0000 mL | INTRAMUSCULAR | Status: DC
  Filled 2014-06-27: qty 20

## 2014-06-27 MED ORDER — DOCUSATE SODIUM 100 MG PO CAPS: 100.0000 mg | ORAL_CAPSULE | Freq: Two times a day (BID) | ORAL | Status: DC

## 2014-06-27 MED ORDER — ROCURONIUM BROMIDE 100 MG/10ML IV SOLN
INTRAVENOUS | Status: DC | PRN
  Administered 2014-06-27: 40 mg via INTRAVENOUS
  Administered 2014-06-27 (×2): 5 mg via INTRAVENOUS

## 2014-06-27 MED ORDER — PROMETHAZINE HCL 25 MG/ML IJ SOLN: 6.2500 mg | INTRAMUSCULAR | Status: DC | PRN

## 2014-06-27 MED ORDER — MIDAZOLAM HCL 2 MG/2ML IJ SOLN
INTRAMUSCULAR | Status: AC
Start: 2014-06-27 — End: 2014-06-27
  Filled 2014-06-27: qty 2

## 2014-06-27 MED ORDER — ONDANSETRON HCL 4 MG/2ML IJ SOLN: 4.0000 mg | Freq: Four times a day (QID) | INTRAMUSCULAR | Status: DC | PRN

## 2014-06-27 MED ORDER — ROCURONIUM BROMIDE 50 MG/5ML IV SOLN
INTRAVENOUS | Status: AC
  Filled 2014-06-27: qty 1

## 2014-06-27 MED ORDER — METOCLOPRAMIDE HCL 10 MG PO TABS
5.0000 mg | ORAL_TABLET | Freq: Three times a day (TID) | ORAL | Status: DC | PRN
Start: 2014-06-27 — End: 2014-06-28

## 2014-06-27 MED ORDER — ACETAMINOPHEN 10 MG/ML IV SOLN
1000.0000 mg | Freq: Once | INTRAVENOUS | Status: AC
  Administered 2014-06-27: 1000 mg via INTRAVENOUS

## 2014-06-27 MED ORDER — HYDROCODONE-ACETAMINOPHEN 5-325 MG PO TABS
1.0000 | ORAL_TABLET | ORAL | Status: DC | PRN
  Administered 2014-06-27 – 2014-06-28 (×2): 1 via ORAL
  Filled 2014-06-27: qty 2
  Filled 2014-06-27: qty 1
  Filled 2014-06-27: qty 2
  Filled 2014-06-27: qty 1

## 2014-06-27 MED ORDER — PRAVASTATIN SODIUM 40 MG PO TABS
40.0000 mg | ORAL_TABLET | Freq: Every day | ORAL | Status: DC
  Administered 2014-06-27 – 2014-06-28 (×2): 40 mg via ORAL
  Filled 2014-06-27 (×2): qty 1

## 2014-06-27 MED ORDER — OXYCODONE-ACETAMINOPHEN 5-325 MG PO TABS: 2.0000 | ORAL_TABLET | ORAL | Status: DC | PRN

## 2014-06-27 MED ORDER — DEXTROSE-NACL 5-0.45 % IV SOLN
INTRAVENOUS | Status: DC
  Administered 2014-06-27: via INTRAVENOUS

## 2014-06-27 MED ORDER — LISINOPRIL 10 MG PO TABS
10.0000 mg | ORAL_TABLET | Freq: Every day | ORAL | Status: DC
  Administered 2014-06-27 – 2014-06-28 (×2): 10 mg via ORAL
  Filled 2014-06-27 (×2): qty 1

## 2014-06-27 MED ORDER — CEFAZOLIN SODIUM-DEXTROSE 2-3 GM-% IV SOLR
2.0000 g | Freq: Four times a day (QID) | INTRAVENOUS | Status: AC
  Administered 2014-06-27 (×2): 2 g via INTRAVENOUS
  Filled 2014-06-27 (×2): qty 50

## 2014-06-27 MED ORDER — ARTIFICIAL TEARS OP OINT
TOPICAL_OINTMENT | OPHTHALMIC | Status: AC
  Filled 2014-06-27: qty 3.5

## 2014-06-27 MED ORDER — MENTHOL 3 MG MT LOZG: 1.0000 | LOZENGE | OROMUCOSAL | Status: DC | PRN

## 2014-06-27 MED ORDER — SUCCINYLCHOLINE CHLORIDE 20 MG/ML IJ SOLN
INTRAMUSCULAR | Status: DC | PRN
  Administered 2014-06-27: 120 mg via INTRAVENOUS

## 2014-06-27 MED ORDER — CARVEDILOL 12.5 MG PO TABS
12.5000 mg | ORAL_TABLET | Freq: Two times a day (BID) | ORAL | Status: DC
  Administered 2014-06-27 – 2014-06-28 (×2): 12.5 mg via ORAL
  Filled 2014-06-27 (×4): qty 1

## 2014-06-27 MED ORDER — PROPOFOL 10 MG/ML IV BOLUS
INTRAVENOUS | Status: DC | PRN
  Administered 2014-06-27: 180 mg via INTRAVENOUS

## 2014-06-27 MED ORDER — FENTANYL CITRATE 0.05 MG/ML IJ SOLN
INTRAMUSCULAR | Status: DC | PRN
  Administered 2014-06-27 (×3): 50 ug via INTRAVENOUS
  Administered 2014-06-27 (×2): 25 ug via INTRAVENOUS
  Administered 2014-06-27: 50 ug via INTRAVENOUS

## 2014-06-27 MED ORDER — PHENYLEPHRINE 40 MCG/ML (10ML) SYRINGE FOR IV PUSH (FOR BLOOD PRESSURE SUPPORT)
PREFILLED_SYRINGE | INTRAVENOUS | Status: AC
  Filled 2014-06-27: qty 10

## 2014-06-27 MED ORDER — PHENOL 1.4 % MT LIQD: 1.0000 | OROMUCOSAL | Status: DC | PRN

## 2014-06-27 MED ORDER — BUPIVACAINE LIPOSOME 1.3 % IJ SUSP
INTRAMUSCULAR | Status: DC | PRN
  Administered 2014-06-27: 20 mL

## 2014-06-27 MED ORDER — ACETAMINOPHEN 10 MG/ML IV SOLN
INTRAVENOUS | Status: AC
  Administered 2014-06-27: 1000 mg via INTRAVENOUS
  Filled 2014-06-27: qty 100

## 2014-06-27 MED ORDER — CARVEDILOL 12.5 MG PO TABS
12.5000 mg | ORAL_TABLET | Freq: Once | ORAL | Status: AC
  Administered 2014-06-27: 12.5 mg via ORAL

## 2014-06-27 MED ORDER — FENTANYL CITRATE 0.05 MG/ML IJ SOLN
INTRAMUSCULAR | Status: AC
  Filled 2014-06-27: qty 5

## 2014-06-27 MED ORDER — 0.9 % SODIUM CHLORIDE (POUR BTL) OPTIME
TOPICAL | Status: DC | PRN
  Administered 2014-06-27: 1000 mL

## 2014-06-27 MED ORDER — MIDAZOLAM HCL 5 MG/5ML IJ SOLN
INTRAMUSCULAR | Status: DC | PRN
  Administered 2014-06-27 (×2): 0.5 mg via INTRAVENOUS
  Administered 2014-06-27: 1 mg via INTRAVENOUS

## 2014-06-27 MED ORDER — NEOSTIGMINE METHYLSULFATE 10 MG/10ML IV SOLN
INTRAVENOUS | Status: AC
  Filled 2014-06-27: qty 1

## 2014-06-27 MED ORDER — METHOCARBAMOL 1000 MG/10ML IJ SOLN
500.0000 mg | Freq: Four times a day (QID) | INTRAVENOUS | Status: DC | PRN
  Administered 2014-06-27: 500 mg via INTRAVENOUS
  Filled 2014-06-27 (×2): qty 5

## 2014-06-27 MED ORDER — PHENYLEPHRINE HCL 10 MG/ML IJ SOLN
INTRAMUSCULAR | Status: DC | PRN
  Administered 2014-06-27 (×5): 80 ug via INTRAVENOUS

## 2014-06-27 MED ORDER — DEXTROSE 5 % IV SOLN
INTRAVENOUS | Status: DC | PRN
  Administered 2014-06-27: 08:00:00 via INTRAVENOUS

## 2014-06-27 MED ORDER — HYDROMORPHONE HCL 1 MG/ML IJ SOLN
0.2500 mg | INTRAMUSCULAR | Status: DC | PRN
  Administered 2014-06-27: 0.5 mg via INTRAVENOUS
  Administered 2014-06-27: 0.25 mg via INTRAVENOUS
  Administered 2014-06-27 (×4): 0.5 mg via INTRAVENOUS
  Administered 2014-06-27: 0.25 mg via INTRAVENOUS

## 2014-06-27 MED ORDER — SUCCINYLCHOLINE CHLORIDE 20 MG/ML IJ SOLN
INTRAMUSCULAR | Status: AC
  Filled 2014-06-27: qty 1

## 2014-06-27 MED ORDER — METHOCARBAMOL 500 MG PO TABS
500.0000 mg | ORAL_TABLET | Freq: Four times a day (QID) | ORAL | Status: DC | PRN
  Administered 2014-06-28: 500 mg via ORAL
  Filled 2014-06-27 (×3): qty 1

## 2014-06-27 MED ORDER — ONDANSETRON HCL 4 MG PO TABS: 4.0000 mg | ORAL_TABLET | Freq: Three times a day (TID) | ORAL | Status: DC | PRN

## 2014-06-27 MED ORDER — ONDANSETRON HCL 4 MG/2ML IJ SOLN
INTRAMUSCULAR | Status: DC | PRN
  Administered 2014-06-27: 4 mg via INTRAVENOUS

## 2014-06-27 MED ORDER — ACETAMINOPHEN 325 MG PO TABS: 650.0000 mg | ORAL_TABLET | Freq: Four times a day (QID) | ORAL | Status: DC | PRN

## 2014-06-27 MED ORDER — CARVEDILOL 12.5 MG PO TABS
ORAL_TABLET | ORAL | Status: AC
  Administered 2014-06-27: 12.5 mg via ORAL
  Filled 2014-06-27: qty 1

## 2014-06-27 SURGICAL SUPPLY — 64 items
BLADE SAW SGTL 18X1.27X75 (BLADE) ×2 IMPLANT
BLADE SAW SGTL 18X1.27X75MM (BLADE) ×1
BLADE SURG ROTATE 9660 (MISCELLANEOUS) IMPLANT
BNDG COHESIVE 4X5 TAN STRL (GAUZE/BANDAGES/DRESSINGS) ×3 IMPLANT
CAPT HIP TOTAL 2 ×2 IMPLANT
COVER SURGICAL LIGHT HANDLE (MISCELLANEOUS) ×3 IMPLANT
DRAPE C-ARM 42X72 X-RAY (DRAPES) ×3 IMPLANT
DRAPE IMP U-DRAPE 54X76 (DRAPES) ×6 IMPLANT
DRAPE INCISE IOBAN 66X45 STRL (DRAPES) ×3 IMPLANT
DRAPE ORTHO SPLIT 77X108 STRL (DRAPES) ×6
DRAPE PROXIMA HALF (DRAPES) ×3 IMPLANT
DRAPE SURG 17X23 STRL (DRAPES) ×3 IMPLANT
DRAPE SURG ORHT 6 SPLT 77X108 (DRAPES) ×2 IMPLANT
DRAPE U-SHAPE 47X51 STRL (DRAPES) ×6 IMPLANT
DRSG AQUACEL AG ADV 3.5X10 (GAUZE/BANDAGES/DRESSINGS) ×3 IMPLANT
DURAPREP 26ML APPLICATOR (WOUND CARE) ×3 IMPLANT
ELECT BLADE 4.0 EZ CLEAN MEGAD (MISCELLANEOUS) ×6
ELECT CAUTERY BLADE 6.4 (BLADE) ×3 IMPLANT
ELECT REM PT RETURN 9FT ADLT (ELECTROSURGICAL) ×3
ELECTRODE BLDE 4.0 EZ CLN MEGD (MISCELLANEOUS) ×1 IMPLANT
ELECTRODE REM PT RTRN 9FT ADLT (ELECTROSURGICAL) ×1 IMPLANT
FACESHIELD WRAPAROUND (MASK) ×6 IMPLANT
FACESHIELD WRAPAROUND OR TEAM (MASK) ×2 IMPLANT
GLOVE BIO SURGEON STRL SZ7.5 (GLOVE) ×3 IMPLANT
GLOVE BIOGEL M 7.0 STRL (GLOVE) IMPLANT
GLOVE BIOGEL PI IND STRL 7.0 (GLOVE) IMPLANT
GLOVE BIOGEL PI IND STRL 7.5 (GLOVE) IMPLANT
GLOVE BIOGEL PI IND STRL 8 (GLOVE) ×2 IMPLANT
GLOVE BIOGEL PI INDICATOR 7.0 (GLOVE) ×2
GLOVE BIOGEL PI INDICATOR 7.5 (GLOVE)
GLOVE BIOGEL PI INDICATOR 8 (GLOVE) ×4
GLOVE BIOGEL PI ORTHO PRO SZ8 (GLOVE)
GLOVE ECLIPSE 6.5 STRL STRAW (GLOVE) ×2 IMPLANT
GLOVE PI ORTHO PRO STRL SZ8 (GLOVE) IMPLANT
GLOVE SURG ORTHO 8.0 STRL STRW (GLOVE) IMPLANT
GOWN STRL REUS W/ TWL LRG LVL3 (GOWN DISPOSABLE) ×3 IMPLANT
GOWN STRL REUS W/ TWL XL LVL3 (GOWN DISPOSABLE) ×1 IMPLANT
GOWN STRL REUS W/TWL LRG LVL3 (GOWN DISPOSABLE) ×9
GOWN STRL REUS W/TWL XL LVL3 (GOWN DISPOSABLE) ×3
KIT BASIN OR (CUSTOM PROCEDURE TRAY) ×3 IMPLANT
KIT ROOM TURNOVER OR (KITS) ×3 IMPLANT
LIQUID BAND (GAUZE/BANDAGES/DRESSINGS) ×2 IMPLANT
MANIFOLD NEPTUNE II (INSTRUMENTS) ×3 IMPLANT
NDL 18GX1X1/2 (RX/OR ONLY) (NEEDLE) ×1 IMPLANT
NDL SAFETY ECLIPSE 18X1.5 (NEEDLE) ×1 IMPLANT
NEEDLE 18GX1X1/2 (RX/OR ONLY) (NEEDLE) ×3 IMPLANT
NEEDLE HYPO 18GX1.5 SHARP (NEEDLE) ×3
NS IRRIG 1000ML POUR BTL (IV SOLUTION) ×3 IMPLANT
PACK TOTAL JOINT (CUSTOM PROCEDURE TRAY) ×3 IMPLANT
PACK UNIVERSAL I (CUSTOM PROCEDURE TRAY) ×3 IMPLANT
PAD ARMBOARD 7.5X6 YLW CONV (MISCELLANEOUS) ×6 IMPLANT
SPONGE LAP 18X18 X RAY DECT (DISPOSABLE) IMPLANT
SUT MNCRL AB 4-0 PS2 18 (SUTURE) ×3 IMPLANT
SUT MON AB 2-0 CT1 36 (SUTURE) ×3 IMPLANT
SUT VIC AB 0 CT1 27 (SUTURE) ×3
SUT VIC AB 0 CT1 27XBRD ANBCTR (SUTURE) ×1 IMPLANT
SUT VIC AB 1 CT1 27 (SUTURE) ×3
SUT VIC AB 1 CT1 27XBRD ANBCTR (SUTURE) ×1 IMPLANT
SYR 50ML LL SCALE MARK (SYRINGE) ×3 IMPLANT
TOWEL OR 17X24 6PK STRL BLUE (TOWEL DISPOSABLE) ×3 IMPLANT
TOWEL OR 17X26 10 PK STRL BLUE (TOWEL DISPOSABLE) ×3 IMPLANT
TOWEL OR NON WOVEN STRL DISP B (DISPOSABLE) ×3 IMPLANT
TRAY FOLEY CATH 16FRSI W/METER (SET/KITS/TRAYS/PACK) IMPLANT
WATER STERILE IRR 1000ML POUR (IV SOLUTION) ×3 IMPLANT

## 2014-06-27 NOTE — Progress Notes (Signed)
Utilization review completed.  

## 2014-06-27 NOTE — Transfer of Care (Signed)
Immediate Anesthesia Transfer of Care Note  Patient: Damon [name]  Procedure(s) Performed: Procedure(s): LEFT TOTAL HIP ARTHROPLASTY ANTERIOR APPROACH (Left)  Patient Location: PACU  Anesthesia Type:General  Level of Consciousness: awake, alert , oriented and patient cooperative  Airway & Oxygen Therapy: Patient Spontanous Breathing and Patient connected to nasal cannula oxygen  Post-op Assessment: Report given to PACU RN, Post -op Vital signs reviewed and stable and Patient moving all extremities  Post vital signs: Reviewed and stable  Complications: No apparent anesthesia complications

## 2014-06-27 NOTE — Op Note (Signed)
06/27/2014  10:03 AM  PATIENT:  Damon Weaver   MRN: [id number]  PRE-OPERATIVE DIAGNOSIS:  OA LEFT HIP  POST-OPERATIVE DIAGNOSIS:  OA LEFT HIP  PROCEDURE:  Procedure(s): LEFT TOTAL HIP ARTHROPLASTY ANTERIOR APPROACH  PREOPERATIVE INDICATIONS:    Damon Weaver is an 69 y.o. male who has a diagnosis of <principal problem not specified> and elected for surgical management after failing conservative treatment.  The risks benefits and alternatives were discussed with the patient including but not limited to the risks of nonoperative treatment, versus surgical intervention including infection, bleeding, nerve injury, periprosthetic fracture, the need for revision surgery, dislocation, leg length discrepancy, blood clots, cardiopulmonary complications, morbidity, mortality, among others, and they were willing to proceed.     OPERATIVE REPORT     SURGEON:   Renette Butters, MD    ASSISTANT:  April Green RNFA, He was necessary for efficiency and safety of the case.     ANESTHESIA:  General    COMPLICATIONS:  None.     COMPONENTS:  Stryker acolade fit femur size 5 with a 36 mm +5 head ball and a PSL acetabular shell size 54 with a  polyethylene liner    PROCEDURE IN DETAIL:   The patient was met in the holding area and  identified.  The appropriate hip was identified and marked at the operative site.  The patient was then transported to the OR  and  placed under general anesthesia.  At that point, the patient was  placed in the supine position and  secured to the operating room table and all bony prominences padded. He received pre-operative antibiotics    The operative lower extremity was prepped from the iliac crest to the distal leg.  Sterile draping was performed.  Time out was performed prior to incision.      Skin incision was made just 2 cm lateral to the ASIS  extending in line with the tensor fascia lata. Electrocautery was used to control all bleeders. I dissected down sharply  to the fascia of the tensor fascia lata was confirmed that the muscle fibers beneath were running posteriorly. I then incised the fascia over the superficial tensor fascia lata in line with the incision. The fascia was elevated off the anterior aspect of the muscle the muscle was retracted posteriorly and protected throughout the case. I then used electrocautery to incise the tensor fascia lata fascia control and all bleeders. Immediately visible was the fat over top of the anterior neck and capsule.  I removed the anterior fat from the capsule and elevated the rectus muscle off of the anterior capsule. I then removed a large time of capsule. The retractors were then placed over the anterior acetabulum as well as around the superior and inferior neck.  I then removed a section of the femoral neck and a napkin ring fashion. Then used the power course to remove the femoral head from the acetabulum and thoroughly irrigated the acetabulum. I sized the femoral head.    I then exposed the deep acetabulum, cleared out any tissue including the ligamentum teres.   After adequate visualization, I excised the labrum, and then sequentially reamed.  I placed the trial acetabulum, which seated nicely, and then impacted the real cup into place.  Appropriate version and inclination was confirmed clinically matching their bony anatomy, and also with the use transverse acetabular ligament.  I placed a 20 mm screw in the posterior/superio position with an excellent bite.    I then placed the  polyethylene liner in place  I then abducted the leg and released the external rotators from the posterior femur allowing it to be easily delivered up lateral and anterior to the acetabulum for preparation of the femoral canal.    I then prepared the proximal femur using the cookie-cutter and then sequentially reamed and broached.  A trial broach, neck, and head was utilized, and I reduced the hip and it was found to have excellent  stability with functional range of motion..  I then impacted the real femoral prosthesis into place into the appropriate version, slightly anteverted to the normal anatomy, and I impacted the real head ball into place. The hip was then reduced and taken through functional range of motion and found to have excellent stability. Leg lengths were restored.  I then irrigated the hip copiously again with, and repaired the fascia with Vicryl, followed by monocryl for the subcutaneous tissue, Monocryl for the skin, Steri-Strips and sterile gauze. The wounds were injected. The patient was then awakened and returned to PACU in stable and satisfactory condition. There were no complications.  POST OPERATIVE PLAN: WBAT, DVT px: SCD's/TED and ASA 325  [name], MD Orthopedic Surgeon [phone]   This note was generated using a template and dragon dictation system. In light of that, I have reviewed the note and all aspects of it are applicable to this case. Any dictation errors are due to the computerized dictation system.

## 2014-06-27 NOTE — Anesthesia Procedure Notes (Signed)
Procedure Name: Intubation Date/Time: 06/27/2014 8:03 AM Performed by: Darcey NoraJAMES, Micholas Drumwright B Pre-anesthesia Checklist: Patient identified, Emergency Drugs available, Suction available and Patient being monitored Patient Re-evaluated:Patient Re-evaluated prior to inductionOxygen Delivery Method: Circle system utilized Preoxygenation: Pre-oxygenation with 100% oxygen Intubation Type: IV induction Ventilation: Mask ventilation without difficulty Laryngoscope Size: Mac and 3 Grade View: Grade II Tube type: Oral Tube size: 7.5 mm Number of attempts: 1 Airway Equipment and Method: Stylet Placement Confirmation: ETT inserted through vocal cords under direct vision,  breath sounds checked- equal and bilateral and positive ETCO2 Secured at: 22 (cm at teeth) cm Tube secured with: Tape Dental Injury: Teeth and Oropharynx as per pre-operative assessment

## 2014-06-27 NOTE — Interval H&P Note (Signed)
History and Physical Interval Note:  06/27/2014 7:48 AM  Damon Weaver  has presented today for surgery, with the diagnosis of OA LEFT HIP  The various methods of treatment have been discussed with the patient and family. After consideration of risks, benefits and other options for treatment, the patient has consented to  Procedure(s): LEFT TOTAL HIP ARTHROPLASTY ANTERIOR APPROACH (Left) as a surgical intervention .  The patient's history has been reviewed, patient examined, no change in status, stable for surgery.  I have reviewed the patient's chart and labs.  Questions were answered to the patient's satisfaction.     Sherra Kimmons, D

## 2014-06-27 NOTE — Anesthesia Preprocedure Evaluation (Addendum)
Anesthesia Evaluation  Patient identified by MRN, date of birth, ID band Patient awake    Reviewed: Allergy & Precautions, NPO status , Patient's Chart, lab work & pertinent test results  Airway Mallampati: II  TM Distance: >3 FB Neck ROM: Full    Dental no notable dental hx. (+) Teeth Intact, Caps, Dental Advisory Given   Pulmonary neg pulmonary ROS, former smoker,  breath sounds clear to auscultation  Pulmonary exam normal       Cardiovascular hypertension, Pt. on medications Rhythm:Regular Rate:Normal     Neuro/Psych negative neurological ROS  negative psych ROS   GI/Hepatic negative GI ROS, Neg liver ROS,   Endo/Other  negative endocrine ROS  Renal/GU negative Renal ROS  negative genitourinary   Musculoskeletal negative musculoskeletal ROS (+)   Abdominal   Peds negative pediatric ROS (+)  Hematology negative hematology ROS (+)   Anesthesia Other Findings beard  Reproductive/Obstetrics negative OB ROS                           Anesthesia Physical Anesthesia Plan  ASA: II  Anesthesia Plan: General   Post-op Pain Management:    Induction: Intravenous  Airway Management Planned: Oral ETT  Additional Equipment:   Intra-op Plan:   Post-operative Plan: Extubation in OR  Informed Consent: I have reviewed the patients History and Physical, chart, labs and discussed the procedure including the risks, benefits and alternatives for the proposed anesthesia with the patient or authorized representative who has indicated his/her understanding and acceptance.   Dental advisory given  Plan Discussed with: CRNA and Surgeon  Anesthesia Plan Comments:         Anesthesia Quick Evaluation

## 2014-06-28 NOTE — Discharge Summary (Signed)
Physician Discharge Summary  Patient ID: Damon [name] MRN: [id number] DOB/AGE: 67/06/1945 67 y.o.  Admit date: 06/27/2014 Discharge date: 06/28/2014  Admission Diagnoses:  <principal problem not specified>  Discharge Diagnoses:  Active Problems:   DJD (degenerative joint disease)   Past Medical History  Diagnosis Date  . Hypertension   . Numbness and tingling     Hx: of left leg  . Arthritis   . Dysrhythmia     Afib  . Seasonal allergies     Surgeries: Procedure(s): LEFT TOTAL HIP ARTHROPLASTY ANTERIOR APPROACH on 06/27/2014   Consultants (if any):    Discharged Condition: Improved  Hospital Course: Damon KayDonald Caccamo is an 69 y.o. male who was admitted 06/27/2014 with a diagnosis of <principal problem not specified> and went to the operating room on 06/27/2014 and underwent the above named procedures.    He was given perioperative antibiotics:  Anti-infectives    Start     Dose/Rate Route Frequency Ordered Stop   06/27/14 1530  ceFAZolin (ANCEF) IVPB 2 g/50 mL premix     2 g100 mL/hr over 30 Minutes Intravenous Every 6 hours 06/27/14 1429 06/27/14 2200   06/27/14 0600  ceFAZolin (ANCEF) IVPB 2 g/50 mL premix     2 g100 mL/hr over 30 Minutes Intravenous On call to O.R. 06/26/14 1405 06/27/14 0810    .  He was given sequential compression devices, early ambulation, and xarelto 20bid for DVT prophylaxis.  He benefited maximally from the hospital stay and there were no complications.    Recent vital signs:  Filed Vitals:   06/28/14 0602  BP: 108/69  Pulse: 90  Temp: 98.8 F (37.1 C)  Resp: 17    Recent laboratory studies:  Lab Results  Component Value Date   HGB 14.4 06/14/2014   HGB 14.2 06/02/2014   HGB 13.5 03/30/2013   Lab Results  Component Value Date   WBC 5.8 06/14/2014   PLT 178 06/14/2014   Lab Results  Component Value Date   INR 1.26 06/14/2014   Lab Results  Component Value Date   NA 134* 06/14/2014   K 5.2* 06/14/2014   CL 98 06/14/2014   CO2 28 06/14/2014   BUN 12 06/14/2014   CREATININE 0.91 06/14/2014   GLUCOSE 104* 06/14/2014    Discharge Medications:     Medication List    STOP taking these medications        acetaminophen 325 MG tablet  Commonly known as:  TYLENOL      TAKE these medications        carvedilol 12.5 MG tablet  Commonly known as:  COREG  Take 1 tablet (12.5 mg total) by mouth 2 (two) times daily.     docusate sodium 100 MG capsule  Commonly known as:  COLACE  Take 1 capsule (100 mg total) by mouth 2 (two) times daily.     ELIQUIS 5 MG Tabs tablet  Generic drug:  apixaban  Take 5 mg by mouth 2 (two) times daily.     lisinopril 10 MG tablet  Commonly known as:  PRINIVIL,ZESTRIL  Take 10 mg by mouth daily.     multivitamin tablet  Take 1 tablet by mouth daily.     ondansetron 4 MG tablet  Commonly known as:  ZOFRAN  Take 1 tablet (4 mg total) by mouth every 8 (eight) hours as needed for nausea or vomiting.     oxyCODONE-acetaminophen 5-325 MG per tablet  Commonly known as:  ROXICET  Take 2 tablets  by mouth every 4 (four) hours as needed.     pravastatin 40 MG tablet  Commonly known as:  PRAVACHOL  Take 40 mg by mouth daily.     rivaroxaban 20 MG Tabs tablet  Commonly known as:  XARELTO  Take 1 tablet (20 mg total) by mouth daily with supper.        Diagnostic Studies: Dg Pelvis Portable  06/27/2014   CLINICAL DATA:  Left hip replacement.  EXAM: PORTABLE PELVIS 1-2 VIEWS  COMPARISON:  CT 03/30/2013  FINDINGS: Examination demonstrates a total left hip arthroplasty intact and normally located. No evidence of acute fracture dislocation. Very minimal degenerative change of the right hip. Mild degenerative change of the spine.  IMPRESSION: No acute findings.  Left total hip arthroplasty without complicating features.   Electronically Signed   By: Elberta Fortis M.D.   On: 06/27/2014 11:38    Disposition: 01-Home or Self Care        Follow-up Information    Follow up with  Sheral Apley, MD.   Specialty:  Orthopedic Surgery   Why:  as scheduled   Contact information:   [address] [place] [address] [phone]       Follow up with [facility].   Why:  Home Health Physical Therapy   Contact information:   [address] [place] [address] [phone]        Signed: Sheral Apley 06/28/2014, 2:07 PM

## 2014-06-28 NOTE — Progress Notes (Signed)
Patient ID: Damon Weaver, male   DOB: 02/07/1946, 69 y.o.   MRN: [id number]     Subjective:  Patient reports pain as mild.  Patient states that he is better today and denies any CP or SOB  Objective:   VITALS:   Filed Vitals:   06/27/14 2331 06/28/14 0242 06/28/14 0400 06/28/14 0602  BP: 125/85 137/74  108/69  Pulse: 105 95  90  Temp: 98.9 F (37.2 C) 98.2 F (36.8 C)  98.8 F (37.1 C)  TempSrc: Oral Oral  Oral  Resp: 16 17 18 17   Height:      Weight:      SpO2: 99% 99%  96%    ABD soft Sensation intact distally Dorsiflexion/Plantar flexion intact Incision: dressing C/D/I and no drainage   Lab Results  Component Value Date   WBC 5.8 06/14/2014   HGB 14.4 06/14/2014   HCT 41.4 06/14/2014   MCV 94.3 06/14/2014   PLT 178 06/14/2014     Assessment/Plan: 1 Day Post-Op   Active Problems:   DJD (degenerative joint disease)   Advance diet Up with therapy Plan for discharge tomorrow but patient is able to go home later today if he does well with PT WBAT Dry dressing PRN Follow up with Dr Margarita Ranaimothy Murphy as scheduled   Torrie MayersUGLAS Livia Tarr 06/28/2014, 8:01 AM   Margarita Ranaimothy Murphy MD [phone]

## 2014-06-28 NOTE — Progress Notes (Signed)
CARE MANAGEMENT NOTE 06/28/2014  Patient:  Mickie KayBASTIAN,Carmino   Account Number:  [id number]  Date Initiated:  06/28/2014  Documentation initiated by:  [facility]Maeson Purohit  Subjective/Objective Assessment:   Left Total Hip     Action/Plan:   lives at home with wife   Anticipated DC Date:  06/28/2014   Anticipated DC Plan:  HOME W HOME HEALTH SERVICES      DC Planning Services  CM consult      [facility]   Choice offered to / List presented to:  C-1 Patient   DME arranged  [facility]      DME agency  [employer]     HH arranged  HH-2 PT      HH agency  [facility]   Status of service:  Completed, signed off Medicare Important Message given?   (If response is "NO", the following Medicare IM given date fields will be blank) Date Medicare IM given:   Medicare IM given by:   Date Additional Medicare IM given:   Additional Medicare IM given by:    Discharge Disposition:  HOME W HOME HEALTH SERVICES  Per UR Regulation:  Reviewed for med. necessity/level of care/duration of stay  If discussed at Long Length of Stay Meetings, dates discussed:    Comments:  06/28/2014 1200 NCM spoke to pt and [facility] Requesting RW for home. [employer] to deliver RW to room prior to dc. Isidoro DonningAlesia Bambi Fehnel RN CCM Case Mgmt phone [phone]

## 2014-06-28 NOTE — Discharge Instructions (Signed)
Keep your incision dry and covered  Bear weight as tolerated  Continue your home anticoagulation that you took prior to surgery planning (xarelto)

## 2014-06-28 NOTE — Evaluation (Signed)
Physical Therapy Evaluation Patient Details Name: Damon Weaver MRN: [id number] DOB: 02/02/1946 Today's Date: 06/28/2014   History of Present Illness  Pt is a 69 y.o. male s/p Lt THA.   Clinical Impression  Pt is s/p Lt THA POD#1 resulting in the deficits listed below (see PT Problem List).  Pt will benefit from skilled PT to increase their independence and safety with mobility to allow discharge to the venue listed below. Plan to practice steps this afternoon with wife present. Possible D/C home this afternoon.      Follow Up Recommendations Home health PT;Supervision/Assistance - 24 hour    Equipment Recommendations  Rolling walker with 5" wheels;3in1 (PT)    Recommendations for Other Services OT consult     Precautions / Restrictions Precautions Precautions: None Precaution Comments: anterior hip- no precautions Restrictions Weight Bearing Restrictions: No      Mobility  Bed Mobility               General bed mobility comments: up in chair  Transfers Overall transfer level: Needs assistance Equipment used: Rolling walker (2 wheeled) Transfers: Sit to/from Stand Sit to Stand: Supervision         General transfer comment: cues for hand placement and technique   Ambulation/Gait Ambulation/Gait assistance: Supervision Ambulation Distance (Feet): 200 Feet Assistive device: Rolling walker (2 wheeled) Gait Pattern/deviations: Step-through pattern;Decreased stride length;Trunk flexed Gait velocity: decreased Gait velocity interpretation: Below normal speed for age/gender General Gait Details: cues for upright posture and gt sequencing; RW adjusted for proper height; may try ambulating with cane if pt feels comfortable next session   Stairs Stairs: Yes Stairs assistance: Min guard Stair Management: One rail Left;Step to pattern;Sideways;Forwards;With walker Number of Stairs: 5 General stair comments: educated on multiple techniques; will practice again this  afternoon with wife prior to D/C   Wheelchair Mobility    Modified Rankin (Stroke Patients Only)       Balance Overall balance assessment: Needs assistance Sitting-balance support: Feet supported;No upper extremity supported Sitting balance-Leahy Scale: Good     Standing balance support: During functional activity;Bilateral upper extremity supported Standing balance-Leahy Scale: Poor Standing balance comment: RW to balance                              Pertinent Vitals/Pain Pain Assessment: 0-10 Pain Score: 4  Pain Location: Lt hip Pain Descriptors / Indicators: Aching Pain Intervention(s): Premedicated before session;Monitored during session;Repositioned    Home Living Family/patient expects to be discharged to:: Private residence Living Arrangements: Spouse/significant other Available Help at Discharge: Family;Available 24 hours/day Type of Home: House Home Access: Stairs to enter Entrance Stairs-Rails: None Entrance Stairs-Number of Steps: 1 Home Layout: Two level;Bed/bath upstairs Home Equipment: None      Prior Function Level of Independence: Independent         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     Hand Dominance        Extremity/Trunk Assessment   Upper Extremity Assessment: Defer to OT evaluation           Lower Extremity Assessment: LLE deficits/detail   LLE Deficits / Details: quad 3+/5 ; hip 3-/5  Cervical / Trunk Assessment: Normal  Communication   Communication: No difficulties  Cognition Arousal/Alertness: Awake/alert Behavior During Therapy: WFL for tasks assessed/performed Overall Cognitive Status: Within Functional Limits for tasks assessed  General Comments      Exercises Total Joint Exercises Ankle Circles/Pumps: AROM;Both;10 reps;Seated Quad Sets: AROM;Left;10 reps;Seated Heel Slides: AAROM;Left;10 reps;Seated Long Arc Quad: AROM;Left;5 reps;Seated      Assessment/Plan     PT Assessment Patient needs continued PT services  PT Diagnosis Abnormality of gait;Generalized weakness;Acute pain   PT Problem List Decreased strength;Decreased activity tolerance;Decreased balance;Decreased mobility;Decreased knowledge of use of DME;Pain  PT Treatment Interventions DME instruction;Gait training;Stair training;Functional mobility training;Therapeutic activities;Therapeutic exercise;Balance training;Neuromuscular re-education;Patient/family education   PT Goals (Current goals can be found in the Care Plan section) Acute Rehab PT Goals Patient Stated Goal: to go home when im ready PT Goal Formulation: With patient Time For Goal Achievement: 06/30/14 Potential to Achieve Goals: Good    Frequency 7X/week   Barriers to discharge        Co-evaluation               End of Session Equipment Utilized During Treatment: Gait belt Activity Tolerance: Patient tolerated treatment well Patient left: in chair;with call bell/phone within reach Nurse Communication: Mobility status         Time: 1610-9604 PT Time Calculation (min) (ACUTE ONLY): 21 min   Charges:   PT Evaluation $Initial PT Evaluation Tier I: 1 Procedure PT Treatments $Gait Training: 8-22 mins   PT G CodesDonell Sievert, [place]  [phone] 06/28/2014, 10:37 AM

## 2014-06-28 NOTE — Progress Notes (Signed)
Physical Therapy Treatment Patient Details Name: Damon Weaver MRN: [id number] DOB: 05/26/1946 Today's Date: 06/28/2014    History of Present Illness Pt is a 69 y.o. male s/p Lt THA.     PT Comments    Pt and wife seen to reinforce stair management technique. Reviewed HEP and car transfer technique. Pt safe from mobility standpoint to D/C home when RW delivered.   Follow Up Recommendations  Home health PT;Supervision/Assistance - 24 hour     Equipment Recommendations  Rolling walker with 5" wheels    Recommendations for Other Services OT consult     Precautions / Restrictions Precautions Precautions: None Precaution Comments: anterior hip- no precautions Restrictions Weight Bearing Restrictions: No    Mobility  Bed Mobility               General bed mobility comments: verbally reviewed technique with pt and wife  Transfers Overall transfer level: Modified independent Equipment used: Rolling walker (2 wheeled);None Transfers: Sit to/from Stand Sit to Stand: Modified independent (Device/Increase time);Supervision         General transfer comment: cues when using cane to stand; more stable with use of RW   Ambulation/Gait Ambulation/Gait assistance: Supervision Ambulation Distance (Feet): 200 Feet Assistive device: Rolling walker (2 wheeled);Straight cane Gait Pattern/deviations: Step-through pattern;Decreased stride length;Shuffle;Narrow base of support Gait velocity: decreased Gait velocity interpretation: Below normal speed for age/gender General Gait Details: attempting ambulating with cane and without RW; pt with short shuffled gt when ambulating without AD; pt with more equal flowing steps with RW; cues for step through gt and equal stride length    Stairs Stairs: Yes Stairs assistance: Supervision Stair Management: No rails;One rail Left;Step to pattern;Sideways;Forwards;With walker Number of Stairs: 8 General stair comments: reinforced multiple  techniques to pt and wife; handwritten instructions given for carryover   Wheelchair Mobility    Modified Rankin (Stroke Patients Only)       Balance Overall balance assessment: No apparent balance deficits (not formally assessed) Sitting-balance support: Feet supported;No upper extremity supported Sitting balance-Leahy Scale: Good     Standing balance support: During functional activity;Bilateral upper extremity supported Standing balance-Leahy Scale: Poor Standing balance comment: RW to balance                     Cognition Arousal/Alertness: Awake/alert Behavior During Therapy: WFL for tasks assessed/performed Overall Cognitive Status: Within Functional Limits for tasks assessed                      Exercises Total Joint Exercises Ankle Circles/Pumps: AROM;Both;10 reps;Seated Quad Sets: AROM;Left;10 reps;Seated Heel Slides: AAROM;Left;10 reps;Seated Long Arc Quad: AROM;Left;Seated;10 reps Knee Flexion: AROM;Left;10 reps;Standing Marching in Standing: AROM;Left;10 reps;Standing Other Exercises Other Exercises: ankle pumps-explained purpose    General Comments General comments (skin integrity, edema, etc.): reviewed car transfer technique and HEP handout       Pertinent Vitals/Pain Pain Assessment: 0-10 Pain Score: 3  Pain Location: Lt hip  Pain Descriptors / Indicators: Sore Pain Intervention(s): Monitored during session;Premedicated before session;Repositioned    Home Living Family/patient expects to be discharged to:: Private residence Living Arrangements: Spouse/significant other Available Help at Discharge: Family;Available 24 hours/day Type of Home: House Home Access: Stairs to enter Entrance Stairs-Rails: None Home Layout: Two level;Bed/bath upstairs Home Equipment: None      Prior Function Level of Independence: Independent      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   PT Goals (current goals can now be found in the care  plan  section) Acute Rehab PT Goals Patient Stated Goal: home today PT Goal Formulation: With patient Time For Goal Achievement: 06/30/14 Potential to Achieve Goals: Good Progress towards PT goals: Progressing toward goals    Frequency  7X/week    PT Plan Current plan remains appropriate    Co-evaluation             End of Session Equipment Utilized During Treatment: Gait belt Activity Tolerance: Patient tolerated treatment well Patient left: in chair;with call bell/phone within reach     Time: 1135-1200 PT Time Calculation (min) (ACUTE ONLY): 25 min  Charges:  $Gait Training: 8-22 mins $Therapeutic Exercise: 8-22 mins                    G CodesDonell Sievert, [place]  [phone] 06/28/2014, 12:45 PM

## 2014-06-28 NOTE — Evaluation (Signed)
Occupational Therapy Evaluation Patient Details Name: Damon Weaver MRN: [id number] DOB: [birth year] Today's Date: 06/28/2014    History of Present Illness Pt is a 69 y.o. male s/p Lt THA.    Clinical Impression   Pt s/p above. Education provided to pt and spouse and feel pt is safe to d/c home with spouse available to assist. OT signing off.    Follow Up Recommendations  No OT follow up;Supervision - Intermittent    Equipment Recommendations  None recommended by OT    Recommendations for Other Services       Precautions / Restrictions Precautions Precautions: Fall Precaution Comments: anterior hip- no precautions Restrictions Weight Bearing Restrictions: No      Mobility Bed Mobility               General bed mobility comments: not assessed  Transfers Overall transfer level: Needs assistance Equipment used: Rolling walker (2 wheeled);None Transfers: Sit to/from Stand Sit to Stand: Supervision;Min guard         General transfer comment: cues for hand placement         ADL Overall ADL's : Needs assistance/impaired                     Lower Body Dressing: Set up;Supervision/safety;Sit to/from stand   Toilet Transfer: Supervision/safety;Ambulation;RW;Comfort height toilet;Min guard (Min guard for transfer; Supervision for ambulation)       Tub/ Shower Transfer: Min guard;Walk-in shower;Ambulation;Rolling walker   Functional mobility during ADLs: Min guard;Supervision/safety;Rolling walker (ambulated with and without walker) General ADL Comments: Educated on LB dressing technique. Educated on safety tips such as sitting for LB ADLs-pt not wanting shower chair and states he doesn't wash feet/legs. OT did mention using a chair at home. Educated on safety such as shoewear, rugs/items on floor, wife could use belt around waist of pt for safety, and use of bag on walker if pt goes home with walker. Recommended spouse be with him for shower  transfer-educated on technique and pt practiced. Discussed holding onto stable part of door when stepping in if he does not use walker.  Explained use of reacher. Suggested using pillows under bottom at home if surface is too low to help build it up.     Vision  Pt wears glasses.                   Perception     Praxis      Pertinent Vitals/Pain Pain Assessment: 0-10 Pain Score: 4  Pain Location: left hip Pain Descriptors / Indicators: Aching;Dull Pain Intervention(s): Monitored during session;Repositioned-recommended elevating LUE     Hand Dominance     Extremity/Trunk Assessment Upper Extremity Assessment Upper Extremity Assessment: Overall WFL for tasks assessed   Lower Extremity Assessment Lower Extremity Assessment: Defer to PT evaluation LLE Deficits / Details: quad 3+/5 ; hip 3-/5 LLE Coordination: decreased gross motor   Cervical / Trunk Assessment Cervical / Trunk Assessment: Normal   Communication Communication Communication: No difficulties   Cognition Arousal/Alertness: Awake/alert Behavior During Therapy: WFL for tasks assessed/performed Overall Cognitive Status: Within Functional Limits for tasks assessed                     General Comments       Exercises Exercises: Other exercises Other Exercises Other Exercises: ankle pumps-performed and explained purpose   Shoulder Instructions      Home Living Family/patient expects to be discharged to:: Private residence Living Arrangements: Spouse/significant other Available Help  at Discharge: Family;Available 24 hours/day Type of Home: House Home Access: Stairs to enter Entergy CorporationEntrance Stairs-Number of Steps: 1 Entrance Stairs-Rails: None Home Layout: Two level;Bed/bath upstairs Alternate Level Stairs-Number of Steps: 13-15 Alternate Level Stairs-Rails: Left Bathroom Shower/Tub: Producer, television/film/videoWalk-in shower   Bathroom Toilet: Handicapped height (window ledge near)     Home Equipment: None           Prior Functioning/Environment Level of Independence: Independent        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    OT Diagnosis: Acute pain   OT Problem List:     OT Treatment/Interventions:      OT Goals(Current goals can be found in the care plan section)   OT Frequency:     Barriers to D/C:            Co-evaluation              End of Session Equipment Utilized During Treatment: Gait belt;Rolling walker  Activity Tolerance: Patient tolerated treatment well Patient left: in chair;with call bell/phone within reach;with family/visitor present   Time: 1002-1025 OT Time Calculation (min): 23 min Charges:  OT General Charges $OT Visit: 1 Procedure OT Evaluation $Initial OT Evaluation Tier I: 1 Procedure OT Treatments $Self Care/Home Management : 8-22 mins G-CodesEarlie Raveling:    Remy Dia L OTR/L [phone] 06/28/2014, 11:23 AM

## 2014-06-28 NOTE — Progress Notes (Signed)
Discharge instructions gave to pt and her wife. All questions answered. Pt is ready to discharge.

## 2014-06-29 ENCOUNTER — Encounter (HOSPITAL_COMMUNITY): Payer: Self-pay | Admitting: Orthopedic Surgery

## 2014-06-29 DIAGNOSIS — Z96642 Presence of left artificial hip joint: Secondary | ICD-10-CM | POA: Diagnosis not present

## 2014-06-29 DIAGNOSIS — I10 Essential (primary) hypertension: Secondary | ICD-10-CM | POA: Diagnosis not present

## 2014-06-29 DIAGNOSIS — Z471 Aftercare following joint replacement surgery: Secondary | ICD-10-CM | POA: Diagnosis not present

## 2014-07-01 DIAGNOSIS — Z96642 Presence of left artificial hip joint: Secondary | ICD-10-CM | POA: Diagnosis not present

## 2014-07-01 DIAGNOSIS — Z471 Aftercare following joint replacement surgery: Secondary | ICD-10-CM | POA: Diagnosis not present

## 2014-07-01 DIAGNOSIS — I10 Essential (primary) hypertension: Secondary | ICD-10-CM | POA: Diagnosis not present

## 2014-07-03 DIAGNOSIS — Z471 Aftercare following joint replacement surgery: Secondary | ICD-10-CM | POA: Diagnosis not present

## 2014-07-03 DIAGNOSIS — I10 Essential (primary) hypertension: Secondary | ICD-10-CM | POA: Diagnosis not present

## 2014-07-03 DIAGNOSIS — Z96642 Presence of left artificial hip joint: Secondary | ICD-10-CM | POA: Diagnosis not present

## 2014-07-05 DIAGNOSIS — Z471 Aftercare following joint replacement surgery: Secondary | ICD-10-CM | POA: Diagnosis not present

## 2014-07-05 DIAGNOSIS — I10 Essential (primary) hypertension: Secondary | ICD-10-CM | POA: Diagnosis not present

## 2014-07-05 DIAGNOSIS — Z96642 Presence of left artificial hip joint: Secondary | ICD-10-CM | POA: Diagnosis not present

## 2014-07-07 DIAGNOSIS — Z96642 Presence of left artificial hip joint: Secondary | ICD-10-CM | POA: Diagnosis not present

## 2014-07-07 DIAGNOSIS — Z471 Aftercare following joint replacement surgery: Secondary | ICD-10-CM | POA: Diagnosis not present

## 2014-07-07 DIAGNOSIS — I10 Essential (primary) hypertension: Secondary | ICD-10-CM | POA: Diagnosis not present

## 2014-07-10 DIAGNOSIS — I10 Essential (primary) hypertension: Secondary | ICD-10-CM | POA: Diagnosis not present

## 2014-07-10 DIAGNOSIS — Z96642 Presence of left artificial hip joint: Secondary | ICD-10-CM | POA: Diagnosis not present

## 2014-07-10 DIAGNOSIS — Z471 Aftercare following joint replacement surgery: Secondary | ICD-10-CM | POA: Diagnosis not present

## 2014-07-10 NOTE — Anesthesia Postprocedure Evaluation (Signed)
  Anesthesia Post-op Note  Patient: Damon Weaver  Procedure(s) Performed: Procedure(s) (LRB): LEFT TOTAL HIP ARTHROPLASTY ANTERIOR APPROACH (Left)  Patient Location: PACU  Anesthesia Type: General  Level of Consciousness: awake and alert   Airway and Oxygen Therapy: Patient Spontanous Breathing  Post-op Pain: mild  Post-op Assessment: Post-op Vital signs reviewed, Patient's Cardiovascular Status Stable, Respiratory Function Stable, Patent Airway and No signs of Nausea or vomiting  Last Vitals:  Filed Vitals:   06/28/14 0602  BP: 108/69  Pulse: 90  Temp: 37.1 C  Resp: 17    Post-op Vital Signs: stable   Complications: No apparent anesthesia complications

## 2014-07-12 DIAGNOSIS — Z96642 Presence of left artificial hip joint: Secondary | ICD-10-CM | POA: Diagnosis not present

## 2014-07-13 DIAGNOSIS — Z96642 Presence of left artificial hip joint: Secondary | ICD-10-CM | POA: Diagnosis not present

## 2014-07-13 DIAGNOSIS — I10 Essential (primary) hypertension: Secondary | ICD-10-CM | POA: Diagnosis not present

## 2014-07-13 DIAGNOSIS — Z471 Aftercare following joint replacement surgery: Secondary | ICD-10-CM | POA: Diagnosis not present

## 2014-07-18 DIAGNOSIS — R531 Weakness: Secondary | ICD-10-CM | POA: Diagnosis not present

## 2014-07-18 DIAGNOSIS — Z96642 Presence of left artificial hip joint: Secondary | ICD-10-CM | POA: Diagnosis not present

## 2014-07-18 DIAGNOSIS — M25552 Pain in left hip: Secondary | ICD-10-CM | POA: Diagnosis not present

## 2014-07-18 DIAGNOSIS — Z471 Aftercare following joint replacement surgery: Secondary | ICD-10-CM | POA: Diagnosis not present

## 2014-07-20 ENCOUNTER — Ambulatory Visit (INDEPENDENT_AMBULATORY_CARE_PROVIDER_SITE_OTHER): Payer: Commercial Managed Care - HMO | Admitting: Cardiovascular Disease

## 2014-07-20 ENCOUNTER — Encounter: Payer: Self-pay | Admitting: Cardiovascular Disease

## 2014-07-20 VITALS — BP 126/66 | HR 102 | Ht 69.5 in

## 2014-07-20 DIAGNOSIS — I4819 Other persistent atrial fibrillation: Secondary | ICD-10-CM

## 2014-07-20 DIAGNOSIS — Z96642 Presence of left artificial hip joint: Secondary | ICD-10-CM | POA: Diagnosis not present

## 2014-07-20 DIAGNOSIS — Z471 Aftercare following joint replacement surgery: Secondary | ICD-10-CM | POA: Diagnosis not present

## 2014-07-20 DIAGNOSIS — I10 Essential (primary) hypertension: Secondary | ICD-10-CM

## 2014-07-20 DIAGNOSIS — R531 Weakness: Secondary | ICD-10-CM | POA: Diagnosis not present

## 2014-07-20 DIAGNOSIS — M25552 Pain in left hip: Secondary | ICD-10-CM | POA: Diagnosis not present

## 2014-07-20 DIAGNOSIS — I481 Persistent atrial fibrillation: Secondary | ICD-10-CM

## 2014-07-20 MED ORDER — METOPROLOL TARTRATE 50 MG PO TABS: 50.0000 mg | ORAL_TABLET | Freq: Two times a day (BID) | ORAL | Status: DC

## 2014-07-20 NOTE — Progress Notes (Signed)
Cardiology Office Note   Date:  07/20/2014   ID:  Damon Weaver, DOB 07/23/1945, MRN [id number]  PCP:  Thora LanceEHINGER,ROBERT R, MD  Cardiologist:   Vesta MixerNahser, Deryk Bozman J, MD   Problem List  1. Atrial fib 2. Hypertension 3. Hyperlipidemia  Chief Complaint  Patient presents with  . Follow-up    atrial fib      History of Present Illness: Damon KayDonald Nie is a 69 y.o. male who presents for follow up of his atrial fib We increased his coreg during his last visit.  He had a drug rash previously that has resolved when we switch from Eliquis to Xarelto.   He has had a left hip replacement since i last saw him Still has lots of pain. Has had lots of diffuse swelling in his left leg  He was told that this is normal following hip replacement.  No CP , no dyspnea.  No dizziness.   He was late taking a Xarelto dose several days ago    Past Medical History  Diagnosis Date  . Hypertension   . Numbness and tingling     Hx: of left leg  . Arthritis   . Dysrhythmia     Afib  . Seasonal allergies     Past Surgical History  Procedure Laterality Date  . Rotator cuff repair      Hx of right arm  . Colonoscopy w/ biopsies and polypectomy      Hx; of  . Lumbar laminectomy/decompression microdiscectomy N/A 03/24/2013    Procedure: LUMBAR LAMINECTOMY/DECOMPRESSION MICRODISCECTOMY LUMBAR THREE-FOUR,FOUR-FIVE;  Surgeon: [name], MD;  Location: [facility];  Service: Neurosurgery;  Laterality: N/A;  . Total hip arthroplasty Left 06/27/2014    DR MURPHY  . Total hip arthroplasty Left 06/27/2014    Procedure: LEFT TOTAL HIP ARTHROPLASTY ANTERIOR APPROACH;  Surgeon: [name], MD;  Location: [facility];  Service: Orthopedics;  Laterality: Left;     Current Outpatient Prescriptions  Medication Sig Dispense Refill  . carvedilol (COREG) 12.5 MG tablet Take 1 tablet (12.5 mg total) by mouth 2 (two) times daily. 180 tablet 3  . carvedilol (COREG) 6.25 MG tablet Take 6.25 mg by mouth. 12.5 mg  two daily    . lisinopril (PRINIVIL,ZESTRIL) 10 MG tablet Take 10 mg by mouth daily.    . Multiple Vitamin (MULTIVITAMIN) tablet Take 1 tablet by mouth daily.    . mupirocin ointment (BACTROBAN) 2 %     . pravastatin (PRAVACHOL) 40 MG tablet Take 40 mg by mouth daily.    . rivaroxaban (XARELTO) 20 MG TABS tablet Take 1 tablet (20 mg total) by mouth daily with supper. 90 tablet 3   No current facility-administered medications for this visit.    Allergies:   Eliquis    Social History:  The patient  reports that he quit smoking about 32 years ago. He has never used smokeless tobacco. He reports that he drinks alcohol. He reports that he does not use illicit drugs.   Family History:  The patient's family history includes Arthritis in his other.    ROS:  Please see the history of present illness.    Review of Systems: Constitutional:  denies fever, chills, diaphoresis, appetite change and fatigue.  HEENT: denies photophobia, eye pain, redness, hearing loss, ear pain, congestion, sore throat, rhinorrhea, sneezing, neck pain, neck stiffness and tinnitus.  Respiratory: denies SOB, DOE, cough, chest tightness, and wheezing.  Cardiovascular: denies chest pain, palpitations and leg swelling.  Gastrointestinal: denies  nausea, vomiting, abdominal pain, diarrhea, constipation, blood in stool.  Genitourinary: denies dysuria, urgency, frequency, hematuria, flank pain and difficulty urinating.  Musculoskeletal: denies  myalgias, back pain, joint swelling, arthralgias and gait problem.   Skin: denies pallor, rash and wound.  Neurological: denies dizziness, seizures, syncope, weakness, light-headedness, numbness and headaches.   Hematological: denies adenopathy, easy bruising, personal or family bleeding history.  Psychiatric/ Behavioral: denies suicidal ideation, mood changes, confusion, nervousness, sleep disturbance and agitation.       All other systems are reviewed and negative.    PHYSICAL  EXAM: VS:  BP 126/66 mmHg  Pulse 102  Ht 5' 9.5" (1.765 m)  SpO2 97% , BMI There is no weight on file to calculate BMI. GEN: Well nourished, well developed, in no acute distress HEENT: normal Neck: no JVD, carotid bruits, or masses Cardiac: Irreg. Irreg. ; no murmurs, rubs, or gallops,no edema  Respiratory:  clear to auscultation bilaterally, normal work of breathing GI: soft, nontender, nondistended, + BS MS: his right leg is diffusely swollen  Skin: warm and dry, no rash Neuro:  Strength and sensation are intact Psych: normal   EKG:  EKG is not ordered today.    Recent Labs: 06/14/2014: BUN 12; Creatinine 0.91; Hemoglobin 14.4; Platelets 178; Potassium 5.2*; Sodium 134*    Lipid Panel No results found for: CHOL, TRIG, HDL, CHOLHDL, VLDL, LDLCALC, LDLDIRECT    Wt Readings from Last 3 Encounters:  06/27/14 237 lb (107.502 kg)  06/21/14 237 lb (107.502 kg)  06/02/14 236 lb 12.8 oz (107.412 kg)      Other studies Reviewed: Additional studies/ records that were reviewed today include: . Review of the above records demonstrates:    ASSESSMENT AND PLAN:  1. Atrial fib - he missed a dose of Xarelto within the past several weeks.  Will see him back in 1 month and will set up cardioversion at that time  2. Hypertension- BP is well controlled.  3. Hyperlipidemia   Current medicines are reviewed at length with the patient today.  The patient does not have concerns regarding medicines.  The following changes have been made:  DC coreg.  Start metoprolol 50 BID   Labs/ tests ordered today include:  No orders of the defined types were placed in this encounter.     Disposition:   FU with me in 1 months     Signed, Elyshia Kumagai, Deloris Ping, MD  07/20/2014 4:46 PM    [facility] [address] [place], [place], [place]  [address] Phone: [phone]; Fax: [phone]

## 2014-07-20 NOTE — Patient Instructions (Signed)
**Note De-Identified Labella Zahradnik Obfuscation** Your physician has recommended you make the following change in your medication: stop taking Carvedilol and start taking Metoprolol 50 mg twice daily  Your physician recommends that you schedule a follow-up appointment in: 1 month

## 2014-07-21 DIAGNOSIS — E780 Pure hypercholesterolemia: Secondary | ICD-10-CM | POA: Diagnosis not present

## 2014-07-21 DIAGNOSIS — I4891 Unspecified atrial fibrillation: Secondary | ICD-10-CM | POA: Diagnosis not present

## 2014-07-21 DIAGNOSIS — I10 Essential (primary) hypertension: Secondary | ICD-10-CM | POA: Diagnosis not present

## 2014-07-21 DIAGNOSIS — E871 Hypo-osmolality and hyponatremia: Secondary | ICD-10-CM | POA: Diagnosis not present

## 2014-07-24 DIAGNOSIS — R531 Weakness: Secondary | ICD-10-CM | POA: Diagnosis not present

## 2014-07-24 DIAGNOSIS — M25552 Pain in left hip: Secondary | ICD-10-CM | POA: Diagnosis not present

## 2014-07-24 DIAGNOSIS — Z96642 Presence of left artificial hip joint: Secondary | ICD-10-CM | POA: Diagnosis not present

## 2014-07-24 DIAGNOSIS — Z471 Aftercare following joint replacement surgery: Secondary | ICD-10-CM | POA: Diagnosis not present

## 2014-07-26 DIAGNOSIS — Z471 Aftercare following joint replacement surgery: Secondary | ICD-10-CM | POA: Diagnosis not present

## 2014-07-26 DIAGNOSIS — Z96642 Presence of left artificial hip joint: Secondary | ICD-10-CM | POA: Diagnosis not present

## 2014-07-26 DIAGNOSIS — R531 Weakness: Secondary | ICD-10-CM | POA: Diagnosis not present

## 2014-07-26 DIAGNOSIS — M25552 Pain in left hip: Secondary | ICD-10-CM | POA: Diagnosis not present

## 2014-07-31 DIAGNOSIS — Z471 Aftercare following joint replacement surgery: Secondary | ICD-10-CM | POA: Diagnosis not present

## 2014-07-31 DIAGNOSIS — Z96642 Presence of left artificial hip joint: Secondary | ICD-10-CM | POA: Diagnosis not present

## 2014-07-31 DIAGNOSIS — R531 Weakness: Secondary | ICD-10-CM | POA: Diagnosis not present

## 2014-07-31 DIAGNOSIS — M25552 Pain in left hip: Secondary | ICD-10-CM | POA: Diagnosis not present

## 2014-08-02 ENCOUNTER — Telehealth: Payer: Self-pay | Admitting: Cardiovascular Disease

## 2014-08-02 DIAGNOSIS — M25552 Pain in left hip: Secondary | ICD-10-CM | POA: Diagnosis not present

## 2014-08-02 DIAGNOSIS — Z471 Aftercare following joint replacement surgery: Secondary | ICD-10-CM | POA: Diagnosis not present

## 2014-08-02 DIAGNOSIS — R531 Weakness: Secondary | ICD-10-CM | POA: Diagnosis not present

## 2014-08-02 DIAGNOSIS — Z96642 Presence of left artificial hip joint: Secondary | ICD-10-CM | POA: Diagnosis not present

## 2014-08-02 NOTE — Telephone Encounter (Signed)
Patient switched from Coreg to Metoprolol at 1/28 visit.  At that visit BP was 126/66, pulse 102 Routed to Dr. Elease HashimotoNahser for advice.

## 2014-08-02 NOTE — Telephone Encounter (Signed)
New message    Pt c/o BP issue: STAT if pt c/o blurred vision, one-sided weakness or slurred speech  1. What are your last 5 BP readings? 2/6 148/89, 2/7 149/99, 2/8 147/97, 2/9 141/101/ 2/10 158/106   2. Are you having any other symptoms (ex. Dizziness, headache, blurred vision, passed out)? No   3. What is your BP issue? Medication is not working.- xarelto 20 mg taken once a day / metoprolol  50 mg twice a day .

## 2014-08-03 NOTE — Telephone Encounter (Signed)
Spoke with patient who states his heart rate has been approximately 90 bpm daily, however today heart rate is 100.  I advised patient that Dr. Elease HashimotoNahser is pleased with heart rate in the 90's and advises that he increase Lisinopril to 20 mg once daily for elevated blood pressure.  I advised patient to call back if BP does not improve over the next week.  Patient states he forgot his Xarelto yesterday but took it this morning.  I advised patient that he needs to call back to report if he forgets another dose because 30 day time period for Xarelto therapy before cardioversion will need to be restarted.  Patient verbalized understanding and agreement and is aware of appointment with Dr. Elease HashimotoNahser on 3/1.

## 2014-08-03 NOTE — Telephone Encounter (Signed)
Increase Lisinopril to 20 a day for elevated BPs. Anticipate cardioversion soon.

## 2014-08-10 DIAGNOSIS — R531 Weakness: Secondary | ICD-10-CM | POA: Diagnosis not present

## 2014-08-10 DIAGNOSIS — Z96642 Presence of left artificial hip joint: Secondary | ICD-10-CM | POA: Diagnosis not present

## 2014-08-10 DIAGNOSIS — M25552 Pain in left hip: Secondary | ICD-10-CM | POA: Diagnosis not present

## 2014-08-10 DIAGNOSIS — Z471 Aftercare following joint replacement surgery: Secondary | ICD-10-CM | POA: Diagnosis not present

## 2014-08-11 ENCOUNTER — Ambulatory Visit (HOSPITAL_COMMUNITY)
Admission: RE | Admit: 2014-08-11 | Discharge: 2014-08-11 | Disposition: A | Discharge status: Home / Self Care | Payer: Commercial Managed Care - HMO | Source: Ambulatory Visit | Attending: Cardiology | Admitting: Cardiology

## 2014-08-11 ENCOUNTER — Other Ambulatory Visit (HOSPITAL_COMMUNITY): Payer: Self-pay | Admitting: Orthopedic Surgery

## 2014-08-11 DIAGNOSIS — M7989 Other specified soft tissue disorders: Secondary | ICD-10-CM | POA: Diagnosis not present

## 2014-08-11 DIAGNOSIS — M25552 Pain in left hip: Secondary | ICD-10-CM | POA: Diagnosis not present

## 2014-08-11 DIAGNOSIS — Z96642 Presence of left artificial hip joint: Secondary | ICD-10-CM | POA: Diagnosis not present

## 2014-08-11 DIAGNOSIS — Z471 Aftercare following joint replacement surgery: Secondary | ICD-10-CM | POA: Diagnosis not present

## 2014-08-11 DIAGNOSIS — R531 Weakness: Secondary | ICD-10-CM | POA: Diagnosis not present

## 2014-08-11 DIAGNOSIS — M79662 Pain in left lower leg: Secondary | ICD-10-CM

## 2014-08-11 NOTE — Progress Notes (Signed)
Left Lower Ext. Venous Duplex Completed. Negative for DVT or SVT. June Vacha, BS, RDMS, RVT  

## 2014-08-15 ENCOUNTER — Telehealth (HOSPITAL_COMMUNITY): Payer: Self-pay | Admitting: *Deleted

## 2014-08-15 DIAGNOSIS — M25552 Pain in left hip: Secondary | ICD-10-CM | POA: Diagnosis not present

## 2014-08-15 DIAGNOSIS — Z96642 Presence of left artificial hip joint: Secondary | ICD-10-CM | POA: Diagnosis not present

## 2014-08-15 DIAGNOSIS — R531 Weakness: Secondary | ICD-10-CM | POA: Diagnosis not present

## 2014-08-15 DIAGNOSIS — Z471 Aftercare following joint replacement surgery: Secondary | ICD-10-CM | POA: Diagnosis not present

## 2014-08-17 DIAGNOSIS — M25552 Pain in left hip: Secondary | ICD-10-CM | POA: Diagnosis not present

## 2014-08-17 DIAGNOSIS — Z471 Aftercare following joint replacement surgery: Secondary | ICD-10-CM | POA: Diagnosis not present

## 2014-08-17 DIAGNOSIS — R531 Weakness: Secondary | ICD-10-CM | POA: Diagnosis not present

## 2014-08-17 DIAGNOSIS — Z96642 Presence of left artificial hip joint: Secondary | ICD-10-CM | POA: Diagnosis not present

## 2014-08-19 IMAGING — CR DG LUMBAR SPINE 1V
1 series · 1 of 1 positions shown · non-contrast
Comparison: CT 03/11/2013

CLINICAL DATA: Lumbar laminectomy

EXAM:
LUMBAR SPINE - 1 VIEW

[AP]
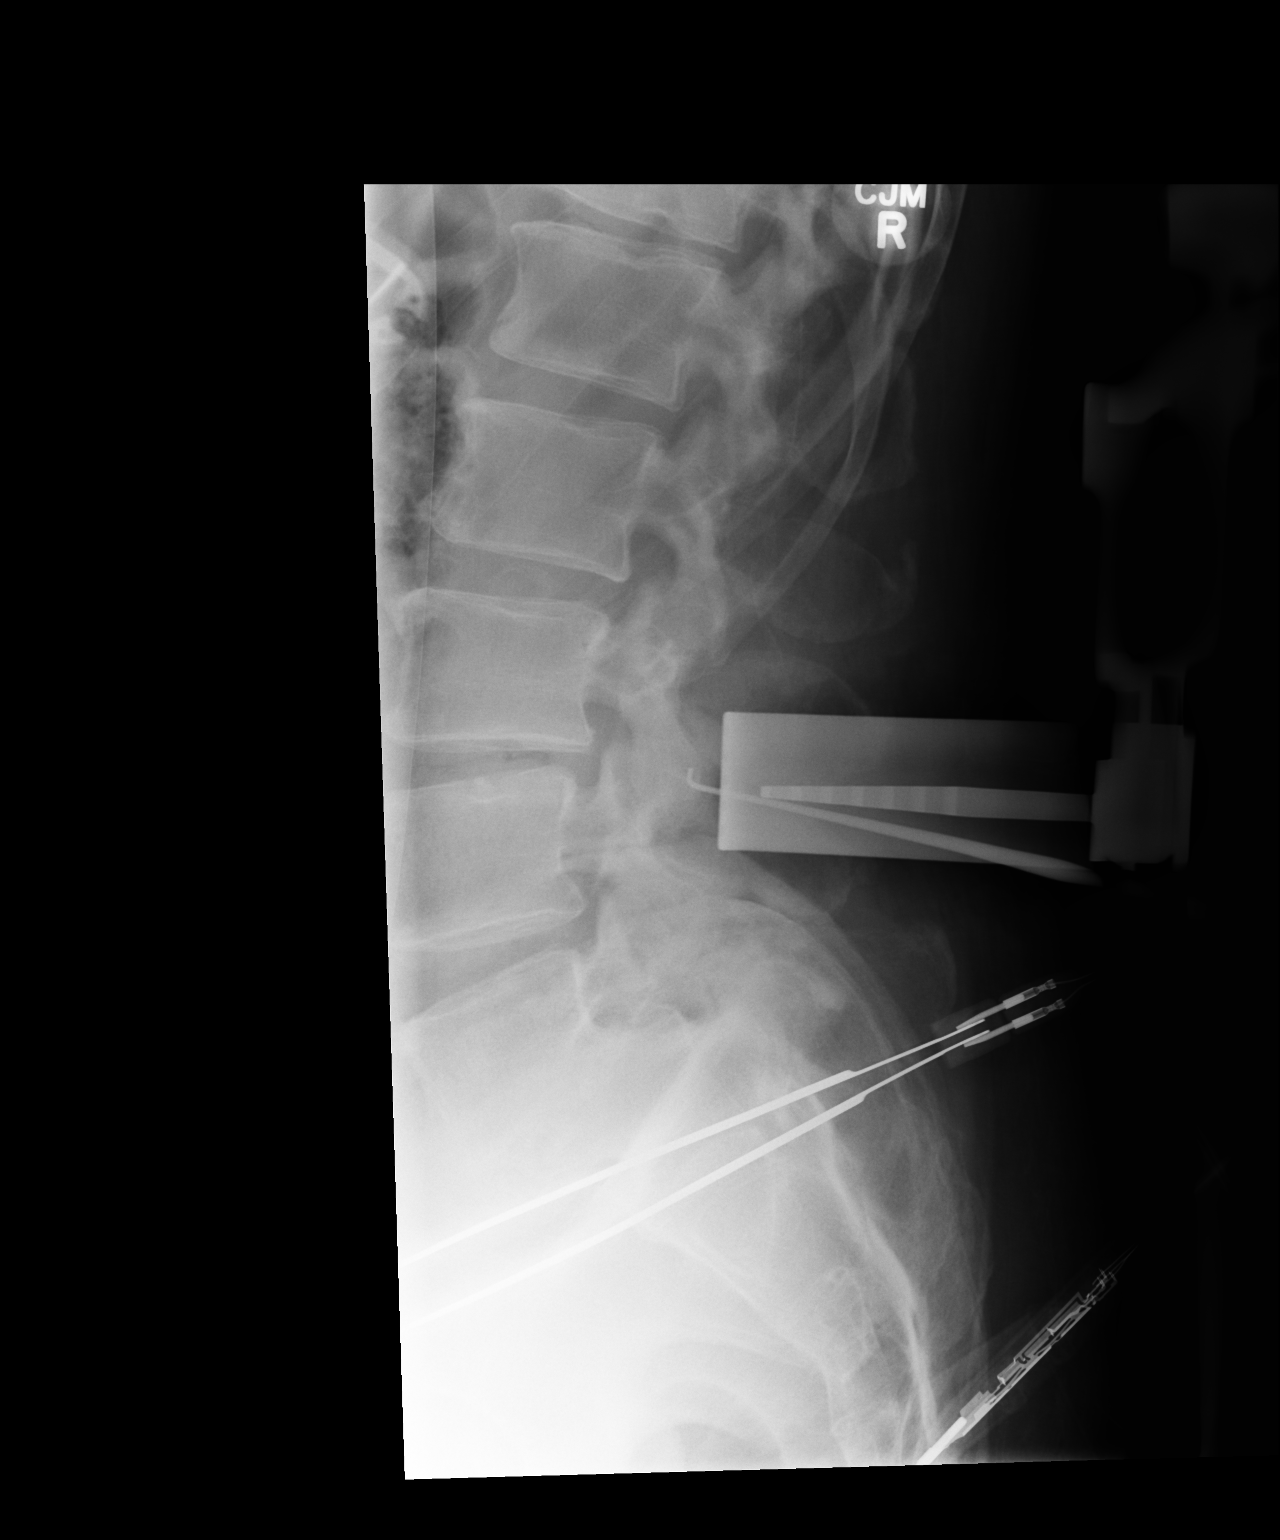

[1 of 1 positions shown; findings below may reference images not displayed]

FINDINGS: Numbering scheme follows that used on the previous exam. This single
lateral intraoperative radiograph shows posterior surgical
instruments, a probe tip projecting posterior to the L3-4 facets.
IMPRESSION: Intraoperative localization

## 2014-08-22 ENCOUNTER — Encounter: Payer: Self-pay | Admitting: Nurse Practitioner

## 2014-08-22 ENCOUNTER — Ambulatory Visit (INDEPENDENT_AMBULATORY_CARE_PROVIDER_SITE_OTHER): Payer: Commercial Managed Care - HMO | Admitting: Cardiovascular Disease

## 2014-08-22 ENCOUNTER — Encounter: Payer: Self-pay | Admitting: Cardiovascular Disease

## 2014-08-22 VITALS — BP 128/78 | HR 79 | Ht 70.0 in | Wt 235.0 lb

## 2014-08-22 DIAGNOSIS — I481 Persistent atrial fibrillation: Secondary | ICD-10-CM | POA: Diagnosis not present

## 2014-08-22 DIAGNOSIS — M25552 Pain in left hip: Secondary | ICD-10-CM | POA: Diagnosis not present

## 2014-08-22 DIAGNOSIS — I10 Essential (primary) hypertension: Secondary | ICD-10-CM

## 2014-08-22 DIAGNOSIS — Z471 Aftercare following joint replacement surgery: Secondary | ICD-10-CM | POA: Diagnosis not present

## 2014-08-22 DIAGNOSIS — R531 Weakness: Secondary | ICD-10-CM | POA: Diagnosis not present

## 2014-08-22 DIAGNOSIS — I4819 Other persistent atrial fibrillation: Secondary | ICD-10-CM

## 2014-08-22 DIAGNOSIS — Z96642 Presence of left artificial hip joint: Secondary | ICD-10-CM | POA: Diagnosis not present

## 2014-08-22 NOTE — Progress Notes (Signed)
Cardiology Office Note   Date:  08/22/2014   ID:  Damon Weaver, DOB 03/17/1946, MRN [id number]  PCP:  Thora LanceEHINGER,ROBERT R, MD  Cardiologist:   Vesta MixerNahser, Philip J, MD   Problem List  1. Atrial fib- CHADS2VASC score of 2 ( age,  HTN)  2. Hypertension 3. Hyperlipidemia 4.Left hip   Chief Complaint  Patient presents with  . Atrial Fibrillation      History of Present Illness: Damon Weaver is a 69 y.o. male who presents for follow up of his atrial fib We increased his coreg during his last visit.  He had a drug rash previously that has resolved when we switch from Eliquis to Xarelto.   He has had a left hip replacement since i last saw him Still has lots of pain. Has had lots of diffuse swelling in his left leg  He was told that this is normal following hip replacement.  No CP , no dyspnea.  No dizziness.   He was late taking a Xarelto dose several days ago  August 22, 2014:  Damon Weaver is doing ok.  Tolerating the afib well. Ready for cardioversion.  Has been taking the Xarelto. Has had persistent swelling in his left leg following hip replacement.  Venous dopplers were negative for DVT  Past Medical History  Diagnosis Date  . Hypertension   . Numbness and tingling     Hx: of left leg  . Arthritis   . Dysrhythmia     Afib  . Seasonal allergies     Past Surgical History  Procedure Laterality Date  . Rotator cuff repair      Hx of right arm  . Colonoscopy w/ biopsies and polypectomy      Hx; of  . Lumbar laminectomy/decompression microdiscectomy N/A 03/24/2013    Procedure: LUMBAR LAMINECTOMY/DECOMPRESSION MICRODISCECTOMY LUMBAR THREE-FOUR,FOUR-FIVE;  Surgeon: [name], MD;  Location: [facility];  Service: Neurosurgery;  Laterality: N/A;  . Total hip arthroplasty Left 06/27/2014    DR MURPHY  . Total hip arthroplasty Left 06/27/2014    Procedure: LEFT TOTAL HIP ARTHROPLASTY ANTERIOR APPROACH;  Surgeon: [name], MD;  Location: [facility];  Service: Orthopedics;   Laterality: Left;     Current Outpatient Prescriptions  Medication Sig Dispense Refill  . lisinopril (PRINIVIL,ZESTRIL) 10 MG tablet Take 20 mg by mouth daily.    . metoprolol (LOPRESSOR) 50 MG tablet Take 1 tablet (50 mg total) by mouth 2 (two) times daily. 60 tablet 0  . Multiple Vitamin (MULTIVITAMIN) tablet Take 1 tablet by mouth daily.    . pravastatin (PRAVACHOL) 40 MG tablet Take 40 mg by mouth daily.    . rivaroxaban (XARELTO) 20 MG TABS tablet Take 1 tablet (20 mg total) by mouth daily with supper. 90 tablet 3   No current facility-administered medications for this visit.    Allergies:   Eliquis    Social History:  The patient  reports that he quit smoking about 32 years ago. He has never used smokeless tobacco. He reports that he drinks alcohol. He reports that he does not use illicit drugs.   Family History:  The patient's family history includes Arthritis in his other.    ROS:  Please see the history of present illness.    Review of Systems: Constitutional:  denies fever, chills, diaphoresis, appetite change and fatigue.  HEENT: denies photophobia, eye pain, redness, hearing loss, ear pain, congestion, sore throat, rhinorrhea, sneezing, neck pain, neck stiffness and tinnitus.  Respiratory: denies  SOB, DOE, cough, chest tightness, and wheezing.  Cardiovascular: denies chest pain, palpitations and leg swelling.  Gastrointestinal: denies nausea, vomiting, abdominal pain, diarrhea, constipation, blood in stool.  Genitourinary: denies dysuria, urgency, frequency, hematuria, flank pain and difficulty urinating.  Musculoskeletal: denies  myalgias, back pain, joint swelling, arthralgias and gait problem.   Skin: denies pallor, rash and wound.  Neurological: denies dizziness, seizures, syncope, weakness, light-headedness, numbness and headaches.   Hematological: denies adenopathy, easy bruising, personal or family bleeding history.  Psychiatric/ Behavioral: denies suicidal  ideation, mood changes, confusion, nervousness, sleep disturbance and agitation.       All other systems are reviewed and negative.    PHYSICAL EXAM: VS:  BP 128/78 mmHg  Pulse 79  Ht 5\' 10"  (1.778 m)  Wt 235 lb (106.595 kg)  BMI 33.72 kg/m2 , BMI Body mass index is 33.72 kg/(m^2). GEN: Well nourished, well developed, in no acute distress HEENT: normal Neck: no JVD, carotid bruits, or masses Cardiac: Irreg. Irreg. ; no murmurs, rubs, or gallops,no edema  Respiratory:  clear to auscultation bilaterally, normal work of breathing GI: soft, nontender, nondistended, + BS MS: his left leg is diffusely swollen  Skin: warm and dry, no rash Neuro:  Strength and sensation are intact Psych: normal   EKG:  EKG is ordered today. It shows:  Atrial fib with rate of 79. .  NS ST abn.    Recent Labs: 06/14/2014: BUN 12; Creatinine 0.91; Hemoglobin 14.4; Platelets 178; Potassium 5.2*; Sodium 134*    Lipid Panel No results found for: CHOL, TRIG, HDL, CHOLHDL, VLDL, LDLCALC, LDLDIRECT    Wt Readings from Last 3 Encounters:  08/22/14 235 lb (106.595 kg)  06/27/14 237 lb (107.502 kg)  06/21/14 237 lb (107.502 kg)      Other studies Reviewed: Additional studies/ records that were reviewed today include: . Review of the above records demonstrates:    ASSESSMENT AND PLAN:  1. Atrial fib -  He is ready for cardioversion. Has not missed Xarelto.   Will schedule him for Cardioversion next Wednesday.   2. Hypertension- BP is well controlled.  3. Hyperlipidemia -  Will follow   4. Left hip replacement -    Current medicines are reviewed at length with the patient today.  The patient does not have concerns regarding medicines.  The following changes have been made:     Labs/ tests ordered today include:  No orders of the defined types were placed in this encounter.     Disposition:   FU with me in 1 months     Signed, Nahser, Deloris PingPhilip J, MD  08/22/2014 9:42 AM    [facility] [address], [place], [place]  [address] Phone: [phone]; Fax: [phone]

## 2014-08-22 NOTE — Patient Instructions (Addendum)
Your physician recommends that you continue on your current medications as directed. Please refer to the Current Medication list given to you today.  Your physician has recommended that you have a Cardioversion (DCCV). Electrical Cardioversion uses a jolt of electricity to your heart either through paddles or wired patches attached to your chest. This is a controlled, usually prescheduled, procedure. Defibrillation is done under light anesthesia in the hospital, and you usually go home the day of the procedure. This is done to get your heart back into a normal rhythm. You are not awake for the procedure. Please see the instruction sheet given to you today. Marcelino DusterMichelle, RN will call you to schedule time  Your physician recommends that you return for lab work on:  Friday or Monday in preparation for DCCV  Your physician recommends that you schedule a follow-up appointment in: 1 month with Dr. Elease HashimotoNahser.    For your  leg edema you  should do  the following 1. Leg elevation - I recommend the Lounge Dr. Leg rest.  See below for details  2. Salt restriction  -  Use potassium chloride instead of regular salt as a salt substitute. 3. Walk regularly 4. Compression hose - [facility] [employer] [facility] 5. Weight loss     Go to [email or web address]

## 2014-08-24 DIAGNOSIS — Z96642 Presence of left artificial hip joint: Secondary | ICD-10-CM | POA: Diagnosis not present

## 2014-08-24 DIAGNOSIS — Z471 Aftercare following joint replacement surgery: Secondary | ICD-10-CM | POA: Diagnosis not present

## 2014-08-24 DIAGNOSIS — R531 Weakness: Secondary | ICD-10-CM | POA: Diagnosis not present

## 2014-08-24 DIAGNOSIS — M25552 Pain in left hip: Secondary | ICD-10-CM | POA: Diagnosis not present

## 2014-08-25 ENCOUNTER — Other Ambulatory Visit: Payer: Commercial Managed Care - HMO

## 2014-08-28 ENCOUNTER — Other Ambulatory Visit (INDEPENDENT_AMBULATORY_CARE_PROVIDER_SITE_OTHER): Payer: Commercial Managed Care - HMO | Admitting: *Deleted

## 2014-08-28 DIAGNOSIS — I481 Persistent atrial fibrillation: Secondary | ICD-10-CM | POA: Diagnosis not present

## 2014-08-28 DIAGNOSIS — I4819 Other persistent atrial fibrillation: Secondary | ICD-10-CM

## 2014-08-28 LAB — CBC WITH DIFFERENTIAL/PLATELET
BASOS ABS: 0 10*3/uL (ref 0.0–0.1)
Basophils Relative: 0.5 % (ref 0.0–3.0)
EOS ABS: 0.1 10*3/uL (ref 0.0–0.7)
Eosinophils Relative: 1.6 % (ref 0.0–5.0)
HEMATOCRIT: 38.7 % — AB (ref 39.0–52.0)
HEMOGLOBIN: 13 g/dL (ref 13.0–17.0)
LYMPHS ABS: 1 10*3/uL (ref 0.7–4.0)
Lymphocytes Relative: 16 % (ref 12.0–46.0)
MCHC: 33.6 g/dL (ref 30.0–36.0)
MCV: 91.9 fl (ref 78.0–100.0)
MONO ABS: 0.7 10*3/uL (ref 0.1–1.0)
Monocytes Relative: 11 % (ref 3.0–12.0)
NEUTROS PCT: 70.9 % (ref 43.0–77.0)
Neutro Abs: 4.4 10*3/uL (ref 1.4–7.7)
PLATELETS: 184 10*3/uL (ref 150.0–400.0)
RBC: 4.21 Mil/uL — ABNORMAL LOW (ref 4.22–5.81)
RDW: 13.2 % (ref 11.5–15.5)
WBC: 6.2 10*3/uL (ref 4.0–10.5)

## 2014-08-28 LAB — BASIC METABOLIC PANEL
BUN: 10 mg/dL (ref 6–23)
CALCIUM: 9.3 mg/dL (ref 8.4–10.5)
CHLORIDE: 102 meq/L (ref 96–112)
CO2: 30 mEq/L (ref 19–32)
CREATININE: 0.86 mg/dL (ref 0.40–1.50)
GFR: 94.42 mL/min (ref >60.00)
GLUCOSE: 91 mg/dL (ref 70–99)
POTASSIUM: 4.2 meq/L (ref 3.5–5.1)
SODIUM: 135 meq/L (ref 135–145)

## 2014-08-28 LAB — PROTIME-INR
INR: 1.7 ratio — AB (ref 0.8–1.0)
Prothrombin Time: 18.6 s — ABNORMAL HIGH (ref 9.6–13.1)

## 2014-08-29 DIAGNOSIS — M25552 Pain in left hip: Secondary | ICD-10-CM | POA: Diagnosis not present

## 2014-08-29 DIAGNOSIS — R531 Weakness: Secondary | ICD-10-CM | POA: Diagnosis not present

## 2014-08-29 DIAGNOSIS — Z96642 Presence of left artificial hip joint: Secondary | ICD-10-CM | POA: Diagnosis not present

## 2014-08-29 DIAGNOSIS — Z471 Aftercare following joint replacement surgery: Secondary | ICD-10-CM | POA: Diagnosis not present

## 2014-08-30 ENCOUNTER — Ambulatory Visit (HOSPITAL_COMMUNITY): Payer: Commercial Managed Care - HMO | Admitting: Certified Registered Nurse Anesthetist

## 2014-08-30 ENCOUNTER — Encounter (HOSPITAL_COMMUNITY)
Admission: RE | Disposition: A | Discharge status: Home / Self Care | Payer: Self-pay | Source: Ambulatory Visit | Attending: Cardiovascular Disease

## 2014-08-30 ENCOUNTER — Ambulatory Visit (HOSPITAL_COMMUNITY)
Admission: RE | Admit: 2014-08-30 | Discharge: 2014-08-30 | Disposition: A | Discharge status: Home / Self Care | Payer: Commercial Managed Care - HMO | Source: Ambulatory Visit | Attending: Cardiovascular Disease | Admitting: Cardiovascular Disease

## 2014-08-30 ENCOUNTER — Encounter (HOSPITAL_COMMUNITY): Payer: Self-pay | Admitting: *Deleted

## 2014-08-30 DIAGNOSIS — Z888 Allergy status to other drugs, medicaments and biological substances status: Secondary | ICD-10-CM | POA: Diagnosis not present

## 2014-08-30 DIAGNOSIS — E785 Hyperlipidemia, unspecified: Secondary | ICD-10-CM | POA: Insufficient documentation

## 2014-08-30 DIAGNOSIS — Z87891 Personal history of nicotine dependence: Secondary | ICD-10-CM | POA: Diagnosis not present

## 2014-08-30 DIAGNOSIS — I10 Essential (primary) hypertension: Secondary | ICD-10-CM | POA: Insufficient documentation

## 2014-08-30 DIAGNOSIS — I4891 Unspecified atrial fibrillation: Secondary | ICD-10-CM | POA: Insufficient documentation

## 2014-08-30 DIAGNOSIS — Z96642 Presence of left artificial hip joint: Secondary | ICD-10-CM | POA: Diagnosis not present

## 2014-08-30 DIAGNOSIS — M1990 Unspecified osteoarthritis, unspecified site: Secondary | ICD-10-CM | POA: Insufficient documentation

## 2014-08-30 DIAGNOSIS — I4819 Other persistent atrial fibrillation: Secondary | ICD-10-CM

## 2014-08-30 HISTORY — PX: CARDIOVERSION: SHX1299

## 2014-08-30 SURGERY — CARDIOVERSION
Anesthesia: General

## 2014-08-30 MED ORDER — SODIUM CHLORIDE 0.9 % IJ SOLN: 3.0000 mL | INTRAMUSCULAR | Status: DC | PRN

## 2014-08-30 MED ORDER — ASPIRIN 81 MG PO CHEW: 81.0000 mg | CHEWABLE_TABLET | ORAL | Status: DC

## 2014-08-30 MED ORDER — LIDOCAINE HCL (CARDIAC) 20 MG/ML IV SOLN
INTRAVENOUS | Status: DC | PRN
  Administered 2014-08-30: 40 mg via INTRAVENOUS

## 2014-08-30 MED ORDER — SODIUM CHLORIDE 0.9 % IV SOLN
INTRAVENOUS | Status: DC
  Administered 2014-08-30: 11:00:00 via INTRAVENOUS

## 2014-08-30 MED ORDER — SODIUM CHLORIDE 0.9 % IJ SOLN: 3.0000 mL | Freq: Two times a day (BID) | INTRAMUSCULAR | Status: DC

## 2014-08-30 MED ORDER — SODIUM CHLORIDE 0.9 % IV SOLN: 250.0000 mL | INTRAVENOUS | Status: DC | PRN

## 2014-08-30 MED ORDER — PROPOFOL 10 MG/ML IV BOLUS
INTRAVENOUS | Status: AC
  Filled 2014-08-30: qty 20

## 2014-08-30 MED ORDER — PROPOFOL BOLUS VIA INFUSION
INTRAVENOUS | Status: DC | PRN
  Administered 2014-08-30: 80 mg via INTRAVENOUS

## 2014-08-30 NOTE — Transfer of Care (Signed)
Immediate Anesthesia Transfer of Care Note  Patient: Damon Weaver  Procedure(s) Performed: Procedure(s): CARDIOVERSION (N/A)  Patient Location: Endoscopy Unit  Anesthesia Type:MAC  Level of Consciousness: sedated  Airway & Oxygen Therapy: Patient Spontanous Breathing and Patient connected to nasal cannula oxygen  Post-op Assessment: Report given to RN and Post -op Vital signs reviewed and stable  Post vital signs: Reviewed and stable  Last Vitals:  Filed Vitals:   08/30/14 1041  BP: 151/96  Temp:   Resp: 26    Complications: No apparent anesthesia complications

## 2014-08-30 NOTE — H&P (View-Only) (Signed)
Cardiology Office Note   Date:  08/22/2014   ID:  Damon Weaver, DOB 03/17/1946, MRN [id number]  PCP:  Thora LanceEHINGER,ROBERT R, MD  Cardiologist:   Vesta MixerNahser, Philip J, MD   Problem List  1. Atrial fib- CHADS2VASC score of 2 ( age,  HTN)  2. Hypertension 3. Hyperlipidemia 4.Left hip   Chief Complaint  Patient presents with  . Atrial Fibrillation      History of Present Illness: Damon Weaver is a 69 y.o. male who presents for follow up of his atrial fib We increased his coreg during his last visit.  He had a drug rash previously that has resolved when we switch from Eliquis to Xarelto.   He has had a left hip replacement since i last saw him Still has lots of pain. Has had lots of diffuse swelling in his left leg  He was told that this is normal following hip replacement.  No CP , no dyspnea.  No dizziness.   He was late taking a Xarelto dose several days ago  August 22, 2014:  Damon Weaver is doing ok.  Tolerating the afib well. Ready for cardioversion.  Has been taking the Xarelto. Has had persistent swelling in his left leg following hip replacement.  Venous dopplers were negative for DVT  Past Medical History  Diagnosis Date  . Hypertension   . Numbness and tingling     Hx: of left leg  . Arthritis   . Dysrhythmia     Afib  . Seasonal allergies     Past Surgical History  Procedure Laterality Date  . Rotator cuff repair      Hx of right arm  . Colonoscopy w/ biopsies and polypectomy      Hx; of  . Lumbar laminectomy/decompression microdiscectomy N/A 03/24/2013    Procedure: LUMBAR LAMINECTOMY/DECOMPRESSION MICRODISCECTOMY LUMBAR THREE-FOUR,FOUR-FIVE;  Surgeon: [name], MD;  Location: [facility];  Service: Neurosurgery;  Laterality: N/A;  . Total hip arthroplasty Left 06/27/2014    DR MURPHY  . Total hip arthroplasty Left 06/27/2014    Procedure: LEFT TOTAL HIP ARTHROPLASTY ANTERIOR APPROACH;  Surgeon: [name], MD;  Location: [facility];  Service: Orthopedics;   Laterality: Left;     Current Outpatient Prescriptions  Medication Sig Dispense Refill  . lisinopril (PRINIVIL,ZESTRIL) 10 MG tablet Take 20 mg by mouth daily.    . metoprolol (LOPRESSOR) 50 MG tablet Take 1 tablet (50 mg total) by mouth 2 (two) times daily. 60 tablet 0  . Multiple Vitamin (MULTIVITAMIN) tablet Take 1 tablet by mouth daily.    . pravastatin (PRAVACHOL) 40 MG tablet Take 40 mg by mouth daily.    . rivaroxaban (XARELTO) 20 MG TABS tablet Take 1 tablet (20 mg total) by mouth daily with supper. 90 tablet 3   No current facility-administered medications for this visit.    Allergies:   Eliquis    Social History:  The patient  reports that he quit smoking about 32 years ago. He has never used smokeless tobacco. He reports that he drinks alcohol. He reports that he does not use illicit drugs.   Family History:  The patient's family history includes Arthritis in his other.    ROS:  Please see the history of present illness.    Review of Systems: Constitutional:  denies fever, chills, diaphoresis, appetite change and fatigue.  HEENT: denies photophobia, eye pain, redness, hearing loss, ear pain, congestion, sore throat, rhinorrhea, sneezing, neck pain, neck stiffness and tinnitus.  Respiratory: denies  SOB, DOE, cough, chest tightness, and wheezing.  Cardiovascular: denies chest pain, palpitations and leg swelling.  Gastrointestinal: denies nausea, vomiting, abdominal pain, diarrhea, constipation, blood in stool.  Genitourinary: denies dysuria, urgency, frequency, hematuria, flank pain and difficulty urinating.  Musculoskeletal: denies  myalgias, back pain, joint swelling, arthralgias and gait problem.   Skin: denies pallor, rash and wound.  Neurological: denies dizziness, seizures, syncope, weakness, light-headedness, numbness and headaches.   Hematological: denies adenopathy, easy bruising, personal or family bleeding history.  Psychiatric/ Behavioral: denies suicidal  ideation, mood changes, confusion, nervousness, sleep disturbance and agitation.       All other systems are reviewed and negative.    PHYSICAL EXAM: VS:  BP 128/78 mmHg  Pulse 79  Ht 5\' 10"  (1.778 m)  Wt 235 lb (106.595 kg)  BMI 33.72 kg/m2 , BMI Body mass index is 33.72 kg/(m^2). GEN: Well nourished, well developed, in no acute distress HEENT: normal Neck: no JVD, carotid bruits, or masses Cardiac: Irreg. Irreg. ; no murmurs, rubs, or gallops,no edema  Respiratory:  clear to auscultation bilaterally, normal work of breathing GI: soft, nontender, nondistended, + BS MS: his left leg is diffusely swollen  Skin: warm and dry, no rash Neuro:  Strength and sensation are intact Psych: normal   EKG:  EKG is ordered today. It shows:  Atrial fib with rate of 79. .  NS ST abn.    Recent Labs: 06/14/2014: BUN 12; Creatinine 0.91; Hemoglobin 14.4; Platelets 178; Potassium 5.2*; Sodium 134*    Lipid Panel No results found for: CHOL, TRIG, HDL, CHOLHDL, VLDL, LDLCALC, LDLDIRECT    Wt Readings from Last 3 Encounters:  08/22/14 235 lb (106.595 kg)  06/27/14 237 lb (107.502 kg)  06/21/14 237 lb (107.502 kg)      Other studies Reviewed: Additional studies/ records that were reviewed today include: . Review of the above records demonstrates:    ASSESSMENT AND PLAN:  1. Atrial fib -  He is ready for cardioversion. Has not missed Xarelto.   Will schedule him for Cardioversion next Wednesday.   2. Hypertension- BP is well controlled.  3. Hyperlipidemia -  Will follow   4. Left hip replacement -    Current medicines are reviewed at length with the patient today.  The patient does not have concerns regarding medicines.  The following changes have been made:     Labs/ tests ordered today include:  No orders of the defined types were placed in this encounter.     Disposition:   FU with me in 1 months     Signed, Nahser, Deloris PingPhilip J, MD  08/22/2014 9:42 AM    [facility] [address], [place], [place]  [address] Phone: [phone]; Fax: [phone]

## 2014-08-30 NOTE — Anesthesia Postprocedure Evaluation (Signed)
  Anesthesia Post-op Note  Patient: Damon Weaver  Procedure(s) Performed: Procedure(s): CARDIOVERSION (N/A)  Patient Location: PACU  Anesthesia Type:General  Level of Consciousness: awake, alert , oriented and patient cooperative  Airway and Oxygen Therapy: Patient Spontanous Breathing  Post-op Pain: none  Post-op Assessment: Post-op Vital signs reviewed, Patient's Cardiovascular Status Stable, Respiratory Function Stable, Patent Airway, No signs of Nausea or vomiting and Pain level controlled  Post-op Vital Signs: stable  Last Vitals:  Filed Vitals:   08/30/14 1120  BP: 113/60  Pulse: 57  Temp:   Resp: 25    Complications: No apparent anesthesia complications

## 2014-08-30 NOTE — Interval H&P Note (Signed)
History and Physical Interval Note:  08/30/2014 10:11 AM  Imagene Richesonald M Repsher  has presented today for surgery, with the diagnosis of afib  The various methods of treatment have been discussed with the patient and family. After consideration of risks, benefits and other options for treatment, the patient has consented to  Procedure(s): CARDIOVERSION (N/A) as a surgical intervention .  The patient's history has been reviewed, patient examined, no change in status, stable for surgery.  I have reviewed the patient's chart and labs.  Questions were answered to the patient's satisfaction.     Ramesha Poster, Deloris PingPhilip J

## 2014-08-30 NOTE — CV Procedure (Signed)
    Cardioversion Note  Damon RichesDonald M Weaver [id number] 11/25/1945  Procedure: DC Cardioversion Indications: atrial fib   Procedure Details Consent: Obtained Time Out: Verified patient identification, verified procedure, site/side was marked, verified correct patient position, special equipment/implants available, Radiology Safety Procedures followed,  medications/allergies/relevent history reviewed, required imaging and test results available.  Performed  The patient has been on adequate anticoagulation.  The patient received IV Lidocaine 40 mg iv followed by Proprol 80 mg iv  for sedation.  Synchronous cardioversion was performed at 120  joules.  The cardioversion was successful.      Complications: No apparent complications Patient did tolerate procedure well.   Damon MixerPhilip J. Juwon Weaver, Damon HagemanJr., MD, Damon Weaver 08/30/2014, 10:52 AM

## 2014-08-30 NOTE — Discharge Instructions (Signed)

## 2014-08-30 NOTE — Anesthesia Preprocedure Evaluation (Addendum)
Anesthesia Evaluation  Patient identified by MRN, date of birth, ID band Patient awake    Reviewed: Allergy & Precautions, NPO status , Patient's Chart, lab work & pertinent test results  Airway        Dental   Pulmonary former smoker,          Cardiovascular hypertension, Pt. on home beta blockers + dysrhythmias (Xarelto) Atrial Fibrillation  06/07/14 Echo EF 55-60%   Neuro/Psych    GI/Hepatic   Endo/Other    Renal/GU      Musculoskeletal  (+) Arthritis -,   Abdominal   Peds  Hematology   Anesthesia Other Findings   Reproductive/Obstetrics                           Anesthesia Physical Anesthesia Plan  ASA: III  Anesthesia Plan: General   Post-op Pain Management:    Induction: Intravenous  Airway Management Planned: Mask  Additional Equipment:   Intra-op Plan:   Post-operative Plan:   Informed Consent: I have reviewed the patients History and Physical, chart, labs and discussed the procedure including the risks, benefits and alternatives for the proposed anesthesia with the patient or authorized representative who has indicated his/her understanding and acceptance.     Plan Discussed with: CRNA, Anesthesiologist and Surgeon  Anesthesia Plan Comments:         Anesthesia Quick Evaluation

## 2014-08-31 ENCOUNTER — Encounter (HOSPITAL_COMMUNITY): Payer: Self-pay | Admitting: Cardiovascular Disease

## 2014-08-31 DIAGNOSIS — Z96642 Presence of left artificial hip joint: Secondary | ICD-10-CM | POA: Diagnosis not present

## 2014-08-31 DIAGNOSIS — M25552 Pain in left hip: Secondary | ICD-10-CM | POA: Diagnosis not present

## 2014-08-31 DIAGNOSIS — R531 Weakness: Secondary | ICD-10-CM | POA: Diagnosis not present

## 2014-08-31 DIAGNOSIS — Z471 Aftercare following joint replacement surgery: Secondary | ICD-10-CM | POA: Diagnosis not present

## 2014-09-04 ENCOUNTER — Telehealth: Payer: Self-pay | Admitting: Cardiovascular Disease

## 2014-09-04 MED ORDER — METOPROLOL TARTRATE 25 MG PO TABS: 25.0000 mg | ORAL_TABLET | Freq: Two times a day (BID) | ORAL | Status: DC

## 2014-09-04 NOTE — Telephone Encounter (Signed)
Patient not feeling good this past week since cardioversion last week. Patient complained of stomach pain, SOB with moderate activity, nausea, difficulty sleeping and chest feels slightly heavy but no chest pain. Patient stated he felt better this morning, but two hours after taking his medications he started to feel bad again. Patient's SBP have been 115-120 and DBP 65- 70's, and HR 65-70. Last BP 115/65 HR 70. Patient thinks that it might be his medications. (Lopressor, lisinopril, or xarelto) Patient is not aware when he is in A. Fib. Will consult Dr. Elease HashimotoNahser for further instructions.

## 2014-09-04 NOTE — Telephone Encounter (Signed)
Pt c/o Shortness Of Breath: STAT if SOB developed within the last 24 hours or pt is noticeably SOB on the phone  1. Are you currently SOB (can you hear that pt is SOB on the phone)?No  2. How long have you been experiencing SOB? 1wk  3. Are you SOB when sitting or when up moving around? Moving around  4. Are you currently experiencing any other symptoms? Nausea/weak/difficult sleeping.

## 2014-09-04 NOTE — Telephone Encounter (Signed)
Called patient back about the decrease in Metoprolol to 25 mg by mouth twice daily. Patient agreed to plan and will call office if this does not help.

## 2014-09-04 NOTE — Telephone Encounter (Signed)
Decrease metoprolol from 50 mg bid to 25 mg bid. If that does not help, please put him on the Flex schedule Thanks

## 2014-09-05 DIAGNOSIS — M25552 Pain in left hip: Secondary | ICD-10-CM | POA: Diagnosis not present

## 2014-09-05 DIAGNOSIS — Z96642 Presence of left artificial hip joint: Secondary | ICD-10-CM | POA: Diagnosis not present

## 2014-09-05 DIAGNOSIS — R531 Weakness: Secondary | ICD-10-CM | POA: Diagnosis not present

## 2014-09-05 DIAGNOSIS — Z471 Aftercare following joint replacement surgery: Secondary | ICD-10-CM | POA: Diagnosis not present

## 2014-09-22 ENCOUNTER — Telehealth: Payer: Self-pay | Admitting: Cardiovascular Disease

## 2014-09-22 DIAGNOSIS — Z96642 Presence of left artificial hip joint: Secondary | ICD-10-CM | POA: Diagnosis not present

## 2014-09-22 NOTE — Telephone Encounter (Signed)
Calling stating he had a CV in 08/30/14 for AFib and has felt fine since then.  BP's have been 115-120/65-70 and HR 65-70.  States he takes his meds regularly. This AM BP 117/70 but HR 106-103.  States he feels fine, no lightheadedness, no SOB. Takes Metoprolol 25 mg BID. States at one time he was taking Metoprolol 50 mg BID but made him nauseated.  Spoke w/Dr. Clifton JamesMcAlhany (DOD) who suggests that he take another 25 mg of Metoprolol now and if HR doesn't come down to call us back.  Advised pt of Dr. Clifton JamesMcAlhany suggestion.  Advised if HR doesn't return to normal range in a couple of hours or he begins to feel lightheaded or SOB then call us back. He verbalizes understanding and will take an extra 25 mg of Metoprolol.

## 2014-09-22 NOTE — Telephone Encounter (Signed)
New Message   Patient feels normal but pulse rate is high into the 100 and over. He states that is usually runs in the 60. ( all at rest ) Please give a call

## 2014-10-10 ENCOUNTER — Ambulatory Visit (INDEPENDENT_AMBULATORY_CARE_PROVIDER_SITE_OTHER): Payer: Commercial Managed Care - HMO | Admitting: Cardiovascular Disease

## 2014-10-10 ENCOUNTER — Encounter: Payer: Self-pay | Admitting: Cardiovascular Disease

## 2014-10-10 VITALS — BP 140/80 | HR 73 | Ht 70.0 in | Wt 235.8 lb

## 2014-10-10 DIAGNOSIS — I481 Persistent atrial fibrillation: Secondary | ICD-10-CM | POA: Diagnosis not present

## 2014-10-10 DIAGNOSIS — I10 Essential (primary) hypertension: Secondary | ICD-10-CM | POA: Diagnosis not present

## 2014-10-10 DIAGNOSIS — I4819 Other persistent atrial fibrillation: Secondary | ICD-10-CM

## 2014-10-10 NOTE — Patient Instructions (Signed)
Medication Instructions:  Your physician recommends that you continue on your current medications as directed. Please refer to the Current Medication list given to you today.   Labwork: None  Testing/Procedures: None  Follow-Up: Your physician wants you to follow-up in: 6 months with Dr. Nahser.  You will receive a reminder letter in the mail two months in advance. If you don't receive a letter, please call our office to schedule the follow-up appointment.      

## 2014-10-10 NOTE — Progress Notes (Signed)
Cardiology Office Note   Date:  10/10/2014   ID:  Damon Weaver, DOB 1946-05-06, MRN [id number]  PCP:  Thora Lance, MD  Cardiologist:   Vesta Mixer, MD   Problem List  1. Atrial fib- CHADS2VASC score of 2 ( age,  HTN)  2. Hypertension 3. Hyperlipidemia 4.Left hip   Chief Complaint  Patient presents with  . Atrial Fibrillation      History of Present Illness: Damon Weaver is a 69 y.o. male who presents for follow up of his atrial fib We increased his coreg during his last visit.  He had a drug rash previously that has resolved when we switch from Eliquis to Xarelto.   He has had a left hip replacement since i last saw him Still has lots of pain. Has had lots of diffuse swelling in his left leg  He was told that this is normal following hip replacement.  No CP , no dyspnea.  No dizziness.   He was late taking a Xarelto dose several days ago  August 22, 2014:  Damon Weaver is doing ok.  Tolerating the afib well. Ready for cardioversion.  Has been taking the Xarelto. Has had persistent swelling in his left leg following hip replacement.  Venous dopplers were negative for DVT  October 10, 2014:  Damon Weaver is seen in follow up today . Had cardioversion last month.  Still feels well.    Past Medical History  Diagnosis Date  . Hypertension   . Numbness and tingling     Hx: of left leg  . Arthritis   . Dysrhythmia     Afib  . Seasonal allergies     Past Surgical History  Procedure Laterality Date  . Rotator cuff repair      Hx of right arm  . Colonoscopy w/ biopsies and polypectomy      Hx; of  . Lumbar laminectomy/decompression microdiscectomy N/A 03/24/2013    Procedure: LUMBAR LAMINECTOMY/DECOMPRESSION MICRODISCECTOMY LUMBAR THREE-FOUR,FOUR-FIVE;  Surgeon: Tia Alert, MD;  Location: [facility];  Service: Neurosurgery;  Laterality: N/A;  . Total hip arthroplasty Left 06/27/2014    DR MURPHY  . Total hip arthroplasty Left 06/27/2014    Procedure: LEFT TOTAL  HIP ARTHROPLASTY ANTERIOR APPROACH;  Surgeon: Sheral Apley, MD;  Location: [facility] OR;  Service: Orthopedics;  Laterality: Left;  . Cardioversion N/A 08/30/2014    Procedure: CARDIOVERSION;  Surgeon: Vesta Mixer, MD;  Location: [facility] ENDOSCOPY;  Service: Cardiovascular;  Laterality: N/A;     Current Outpatient Prescriptions  Medication Sig Dispense Refill  . acetaminophen (TYLENOL) 500 MG tablet Take 500-1,000 mg by mouth every 6 (six) hours as needed (pain).    Marland Kitchen lisinopril (PRINIVIL,ZESTRIL) 10 MG tablet Take 20 mg by mouth daily.    . metoprolol (LOPRESSOR) 25 MG tablet Take 1 tablet (25 mg total) by mouth 2 (two) times daily. 60 tablet 11  . pravastatin (PRAVACHOL) 40 MG tablet Take 40 mg by mouth at bedtime.     . rivaroxaban (XARELTO) 20 MG TABS tablet Take 1 tablet (20 mg total) by mouth daily with supper. 90 tablet 3   No current facility-administered medications for this visit.    Allergies:   Eliquis    Social History:  The patient  reports that he quit smoking about 33 years ago. He has never used smokeless tobacco. He reports that he drinks alcohol. He reports that he does not use illicit drugs.   Family History:  The patient's  family history includes Arthritis in his other.    ROS:  Please see the history of present illness.    Review of Systems: Constitutional:  denies fever, chills, diaphoresis, appetite change and fatigue.  HEENT: denies photophobia, eye pain, redness, hearing loss, ear pain, congestion, sore throat, rhinorrhea, sneezing, neck pain, neck stiffness and tinnitus.  Respiratory: denies SOB, DOE, cough, chest tightness, and wheezing.  Cardiovascular: denies chest pain, palpitations and leg swelling.  Gastrointestinal: denies nausea, vomiting, abdominal pain, diarrhea, constipation, blood in stool.  Genitourinary: denies dysuria, urgency, frequency, hematuria, flank pain and difficulty urinating.  Musculoskeletal: denies  myalgias, back pain, joint  swelling, arthralgias and gait problem.   Skin: denies pallor, rash and wound.  Neurological: denies dizziness, seizures, syncope, weakness, light-headedness, numbness and headaches.   Hematological: denies adenopathy, easy bruising, personal or family bleeding history.  Psychiatric/ Behavioral: denies suicidal ideation, mood changes, confusion, nervousness, sleep disturbance and agitation.       All other systems are reviewed and negative.    PHYSICAL EXAM: VS:  BP 140/80 mmHg  Pulse 73  Ht 5\' 10"  (1.778 m)  Wt 235 lb 12.8 oz (106.958 kg)  BMI 33.83 kg/m2 , BMI Body mass index is 33.83 kg/(m^2). GEN: Well nourished, well developed, in no acute distress HEENT: normal Neck: no JVD, carotid bruits, or masses Cardiac: Irreg. Irreg. ; no murmurs, rubs, or gallops,no edema  Respiratory:  clear to auscultation bilaterally, normal work of breathing GI: soft, nontender, nondistended, + BS MS: his left leg is diffusely swollen  Skin: warm and dry, no rash Neuro:  Strength and sensation are intact Psych: normal   EKG:  EKG is ordered today. It shows:  Atrial fib with rate of  78   Recent Labs: 08/28/2014: BUN 10; Creatinine 0.86; Hemoglobin 13.0; Platelets 184.0; Potassium 4.2; Sodium 135    Lipid Panel No results found for: CHOL, TRIG, HDL, CHOLHDL, VLDL, LDLCALC, LDLDIRECT    Wt Readings from Last 3 Encounters:  10/10/14 235 lb 12.8 oz (106.958 kg)  08/22/14 235 lb (106.595 kg)  06/27/14 237 lb (107.502 kg)      Other studies Reviewed: Additional studies/ records that were reviewed today include: . Review of the above records demonstrates:    ASSESSMENT AND PLAN:  1. Atrial fib -  he was successfully cardio Wert but has gone back into atrial fibrillation. He's completely asymptomatic. His rate is well-controlled. Long discussion about starting antiarrhythmic medications. At this point he'll continue with rate control and anticoagulation and will not try antiarrhythmics  at this time. I'll see him again in 6 months for follow-up visit.  2. Hypertension- BP is well controlled.  3. Hyperlipidemia -  Will follow   4. Left hip replacement -    Current medicines are reviewed at length with the patient today.  The patient does not have concerns regarding medicines.  The following changes have been made:     Labs/ tests ordered today include:  No orders of the defined types were placed in this encounter.     Disposition:   FU with me in 6 months     Signed, Nahser, Deloris PingPhilip J, MD  10/10/2014 10:31 AM    [facility] [address], [place], [place]  [address] Phone: [phone](336) [phone]; Fax: [phone](336) [phone]

## 2014-10-19 ENCOUNTER — Other Ambulatory Visit: Payer: Self-pay | Admitting: *Deleted

## 2014-10-19 MED ORDER — LISINOPRIL 20 MG PO TABS: 20.0000 mg | ORAL_TABLET | Freq: Every day | ORAL | Status: DC

## 2014-10-19 MED ORDER — RIVAROXABAN 20 MG PO TABS: 20.0000 mg | ORAL_TABLET | Freq: Every day | ORAL | Status: DC

## 2014-11-09 ENCOUNTER — Other Ambulatory Visit: Payer: Self-pay

## 2014-11-09 MED ORDER — METOPROLOL TARTRATE 25 MG PO TABS: 25.0000 mg | ORAL_TABLET | Freq: Two times a day (BID) | ORAL | Status: DC

## 2014-12-18 ENCOUNTER — Other Ambulatory Visit: Payer: Self-pay

## 2014-12-27 DIAGNOSIS — Z96642 Presence of left artificial hip joint: Secondary | ICD-10-CM | POA: Diagnosis not present

## 2015-02-05 DIAGNOSIS — H6120 Impacted cerumen, unspecified ear: Secondary | ICD-10-CM | POA: Diagnosis not present

## 2015-02-05 DIAGNOSIS — H9190 Unspecified hearing loss, unspecified ear: Secondary | ICD-10-CM | POA: Diagnosis not present

## 2015-02-14 DIAGNOSIS — E871 Hypo-osmolality and hyponatremia: Secondary | ICD-10-CM | POA: Diagnosis not present

## 2015-02-14 DIAGNOSIS — I4891 Unspecified atrial fibrillation: Secondary | ICD-10-CM | POA: Diagnosis not present

## 2015-02-14 DIAGNOSIS — I10 Essential (primary) hypertension: Secondary | ICD-10-CM | POA: Diagnosis not present

## 2015-02-14 DIAGNOSIS — Z792 Long term (current) use of antibiotics: Secondary | ICD-10-CM | POA: Diagnosis not present

## 2015-02-14 DIAGNOSIS — Z0000 Encounter for general adult medical examination without abnormal findings: Secondary | ICD-10-CM | POA: Diagnosis not present

## 2015-02-14 DIAGNOSIS — Z86010 Personal history of colonic polyps: Secondary | ICD-10-CM | POA: Diagnosis not present

## 2015-02-14 DIAGNOSIS — E780 Pure hypercholesterolemia: Secondary | ICD-10-CM | POA: Diagnosis not present

## 2015-02-14 DIAGNOSIS — H9190 Unspecified hearing loss, unspecified ear: Secondary | ICD-10-CM | POA: Diagnosis not present

## 2015-02-21 DIAGNOSIS — M542 Cervicalgia: Secondary | ICD-10-CM | POA: Diagnosis not present

## 2015-03-05 DIAGNOSIS — M542 Cervicalgia: Secondary | ICD-10-CM | POA: Diagnosis not present

## 2015-03-13 DIAGNOSIS — M5412 Radiculopathy, cervical region: Secondary | ICD-10-CM | POA: Diagnosis not present

## 2015-03-13 DIAGNOSIS — M542 Cervicalgia: Secondary | ICD-10-CM | POA: Diagnosis not present

## 2015-03-30 DIAGNOSIS — M5412 Radiculopathy, cervical region: Secondary | ICD-10-CM | POA: Diagnosis not present

## 2015-03-30 DIAGNOSIS — M542 Cervicalgia: Secondary | ICD-10-CM | POA: Diagnosis not present

## 2015-04-16 DIAGNOSIS — M5412 Radiculopathy, cervical region: Secondary | ICD-10-CM | POA: Diagnosis not present

## 2015-04-16 DIAGNOSIS — M542 Cervicalgia: Secondary | ICD-10-CM | POA: Diagnosis not present

## 2015-04-27 ENCOUNTER — Ambulatory Visit (INDEPENDENT_AMBULATORY_CARE_PROVIDER_SITE_OTHER): Payer: Commercial Managed Care - HMO | Admitting: Cardiovascular Disease

## 2015-04-27 ENCOUNTER — Encounter: Payer: Self-pay | Admitting: Cardiovascular Disease

## 2015-04-27 VITALS — BP 132/90 | HR 88 | Ht 70.0 in | Wt 243.0 lb

## 2015-04-27 DIAGNOSIS — I4819 Other persistent atrial fibrillation: Secondary | ICD-10-CM

## 2015-04-27 DIAGNOSIS — I481 Persistent atrial fibrillation: Secondary | ICD-10-CM | POA: Diagnosis not present

## 2015-04-27 NOTE — Patient Instructions (Addendum)
Check the price of the following anticoagulants:  Pradaxa 150 mg twice a day Xarelto 20 mg a day Eliquis 5 mg twice a day Savaysa 60 mg a day.  Medication Instructions:  Your physician recommends that you continue on your current medications as directed. Please refer to the Current Medication list given to you today.   Labwork: None Ordered   Testing/Procedures: None Ordered   Follow-Up: Your physician recommends that you schedule a follow-up appointment with [facility] in 2-3 weeks   Your physician wants you to follow-up in: 6 months with Dr. Elease HashimotoNahser.  You will receive a reminder letter in the mail two months in advance. If you don't receive a letter, please call our office to schedule the follow-up appointment.   If you need a refill on your cardiac medications before your next appointment, please call your pharmacy.   Thank you for choosing [facility]! Eligha BridegroomMichelle Swinyer, RN [phone]

## 2015-04-27 NOTE — Progress Notes (Signed)
Cardiology Office Note   Date:  04/27/2015   ID:  Damon Weaver, DOB 01-18-1946, MRN [id number]  PCP:  Thora Lance, MD  Cardiologist:   Vesta Mixer, MD   Problem List  1. Atrial fib- CHADS2VASC score of 2 ( age,  HTN)  2. Hypertension 3. Hyperlipidemia 4. Aortic Insufficiency  - Moderate  AI .  5. Mild MR:    Chief Complaint  Patient presents with  . Follow-up    atrial fib      History of Present Illness: Damon Weaver is a 69 y.o. male who presents for follow up of his atrial fib We increased his coreg during his last visit.  He had a drug rash previously that has resolved when we switch from Eliquis to Xarelto.   He has had a left hip replacement since i last saw him Still has lots of pain. Has had lots of diffuse swelling in his left leg  He was told that this is normal following hip replacement.  No CP , no dyspnea.  No dizziness.   He was late taking a Xarelto dose several days ago  August 22, 2014:  Damon Weaver is doing ok.  Tolerating the afib well. Ready for cardioversion.  Has been taking the Xarelto. Has had persistent swelling in his left leg following hip replacement.  Venous dopplers were negative for DVT  October 10, 2014:  Damon Weaver is seen in follow up today . Had cardioversion last month.  Still feels well.    Nov. 4, 2016:  Damon Weaver is seen today for follow up of his atrial fib.  Has been cardioverted in the past.  Went back into atrial fib after a couple of days  Still fairly asymptomatic. Notices a bit more fatigue but otherwise is doing ok Trying to walk more .  Has some shortness of breath walking up a long hill.   Past Medical History  Diagnosis Date  . Hypertension   . Numbness and tingling     Hx: of left leg  . Arthritis   . Dysrhythmia     Afib  . Seasonal allergies     Past Surgical History  Procedure Laterality Date  . Rotator cuff repair      Hx of right arm  . Colonoscopy w/ biopsies and polypectomy      Hx; of  .  Lumbar laminectomy/decompression microdiscectomy N/A 03/24/2013    Procedure: LUMBAR LAMINECTOMY/DECOMPRESSION MICRODISCECTOMY LUMBAR THREE-FOUR,FOUR-FIVE;  Surgeon: Tia Alert, MD;  Location: [facility];  Service: Neurosurgery;  Laterality: N/A;  . Total hip arthroplasty Left 06/27/2014    DR MURPHY  . Total hip arthroplasty Left 06/27/2014    Procedure: LEFT TOTAL HIP ARTHROPLASTY ANTERIOR APPROACH;  Surgeon: Sheral Apley, MD;  Location: [facility] OR;  Service: Orthopedics;  Laterality: Left;  . Cardioversion N/A 08/30/2014    Procedure: CARDIOVERSION;  Surgeon: Vesta Mixer, MD;  Location: [facility] ENDOSCOPY;  Service: Cardiovascular;  Laterality: N/A;     Current Outpatient Prescriptions  Medication Sig Dispense Refill  . acetaminophen (TYLENOL) 500 MG tablet Take 500-1,000 mg by mouth every 6 (six) hours as needed (pain).    Marland Kitchen lisinopril (PRINIVIL,ZESTRIL) 20 MG tablet Take 1 tablet (20 mg total) by mouth daily. 90 tablet 3  . metoprolol tartrate (LOPRESSOR) 25 MG tablet Take 1 tablet (25 mg total) by mouth 2 (two) times daily. 180 tablet 3  . pravastatin (PRAVACHOL) 40 MG tablet Take 40 mg by mouth at bedtime.     Marland Kitchen  rivaroxaban (XARELTO) 20 MG TABS tablet Take 1 tablet (20 mg total) by mouth daily with supper. 90 tablet 3   No current facility-administered medications for this visit.    Allergies:   Eliquis    Social History:  The patient  reports that he quit smoking about 33 years ago. He has never used smokeless tobacco. He reports that he drinks alcohol. He reports that he does not use illicit drugs.   Family History:  The patient's family history includes Arthritis in his other.    ROS:  Please see the history of present illness.    Review of Systems: Constitutional:  denies fever, chills, diaphoresis, appetite change and fatigue.  HEENT: denies photophobia, eye pain, redness, hearing loss, ear pain, congestion, sore throat, rhinorrhea, sneezing, neck pain, neck stiffness and  tinnitus.  Respiratory: denies SOB, DOE, cough, chest tightness, and wheezing.  Cardiovascular: denies chest pain, palpitations and leg swelling.  Gastrointestinal: denies nausea, vomiting, abdominal pain, diarrhea, constipation, blood in stool.  Genitourinary: denies dysuria, urgency, frequency, hematuria, flank pain and difficulty urinating.  Musculoskeletal: denies  myalgias, back pain, joint swelling, arthralgias and gait problem.   Skin: denies pallor, rash and wound.  Neurological: denies dizziness, seizures, syncope, weakness, light-headedness, numbness and headaches.   Hematological: denies adenopathy, easy bruising, personal or family bleeding history.  Psychiatric/ Behavioral: denies suicidal ideation, mood changes, confusion, nervousness, sleep disturbance and agitation.       All other systems are reviewed and negative.    PHYSICAL EXAM: VS:  BP 132/90 mmHg  Pulse 88  Ht  (1.778 m)  Wt 243 lb (110.224 kg)  BMI 34.87 kg/m2 , BMI Body mass index is 34.87 kg/(m^2). GEN: Well nourished, well developed, in no acute distress HEENT: normal Neck: no JVD, carotid bruits, or masses Cardiac: Irreg. Irreg. ; no murmurs, rubs, or gallops,no edema  Respiratory:  clear to auscultation bilaterally, normal work of breathing GI: soft, nontender, nondistended, + BS MS: his left leg is diffusely swollen  Skin: warm and dry, no rash Neuro:  Strength and sensation are intact Psych: normal   EKG:  EKG is ordered today. It shows:  Atrial fib with rate of  78   Recent Labs: 08/28/2014: BUN 10; Creatinine, Ser 0.86; Hemoglobin 13.0; Platelets 184.0; Potassium 4.2; Sodium 135    Lipid Panel No results found for: CHOL, TRIG, HDL, CHOLHDL, VLDL, LDLCALC, LDLDIRECT    Wt Readings from Last 3 Encounters:  04/27/15 243 lb (110.224 kg)  10/10/14 235 lb 12.8 oz (106.958 kg)  08/22/14 235 lb (106.595 kg)      Other studies Reviewed: Additional studies/ records that were reviewed  today include: . Review of the above records demonstrates:    ASSESSMENT AND PLAN:  1. Atrial fib -  he was successfully cardio Wert but has gone back into atrial fibrillation. He's completely asymptomatic. His rate is well-controlled. Long discussion about starting antiarrhythmic medications. At this point he'll continue with rate control and anticoagulation and will not try antiarrhythmics at this time. I'll see him again in 6 months for follow-up visit.  2. Hypertension- BP is well controlled.  3. Hyperlipidemia -  Will follow   4. Left hip replacement -    Current medicines are reviewed at length with the patient today.  The patient does not have concerns regarding medicines.  The following changes have been made:     Labs/ tests ordered today include:  No orders of the defined types were placed in this encounter.  Disposition:   FU with me in 6 months     Signed, Kyler Germer, Deloris PingPhilip J, MD  04/27/2015 9:28 AM    [facility] [address], [place], [place]  [address] Phone: [phone](336) [phone]; Fax: [phone]) [phone]

## 2015-05-09 IMAGING — CR DG HIP (WITH OR WITHOUT PELVIS) 2-3V*L*
3 series · 3 of 3 positions shown · non-contrast
Comparison: CT abdomen and pelvis 03/30/2013.

CLINICAL DATA: Left hip pain.

EXAM:
LEFT HIP - COMPLETE 2+ VIEW

[view not recorded (1 of 3)]
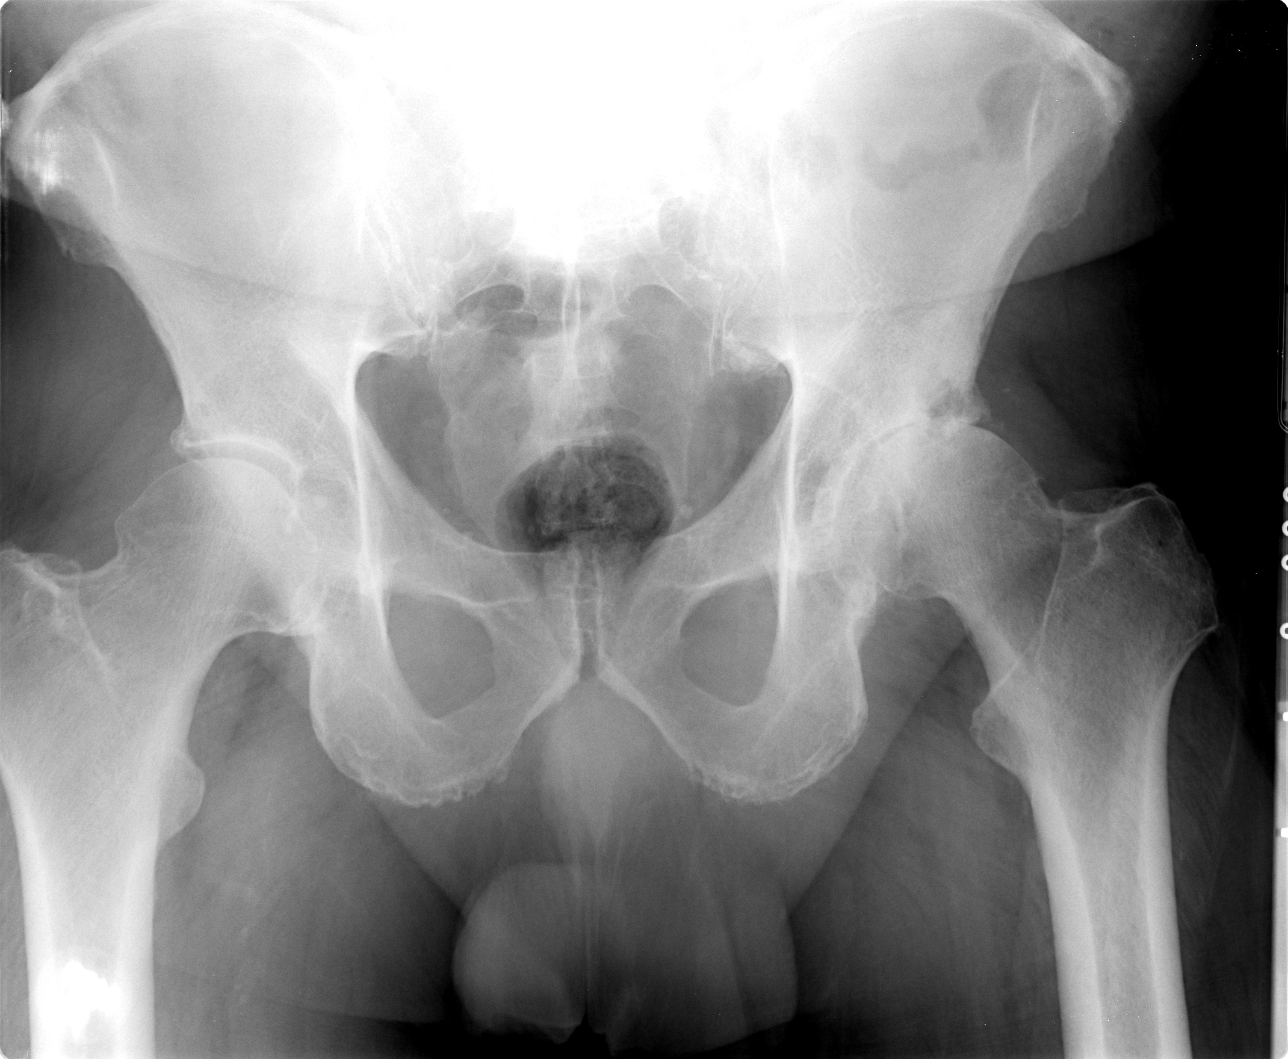

[view not recorded (2 of 3)]
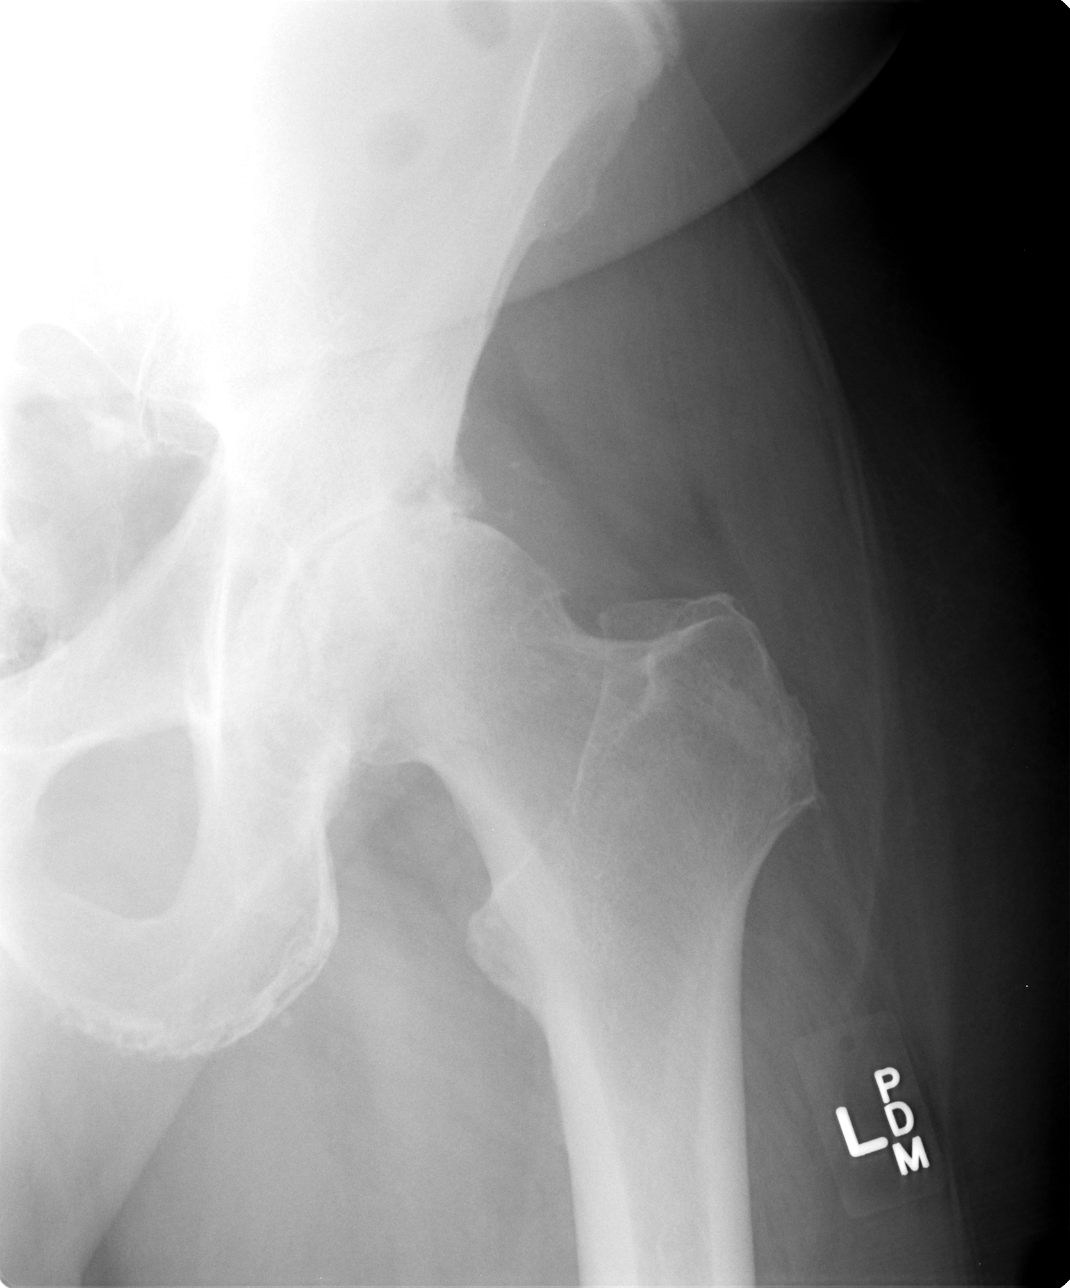

[view not recorded (3 of 3)]
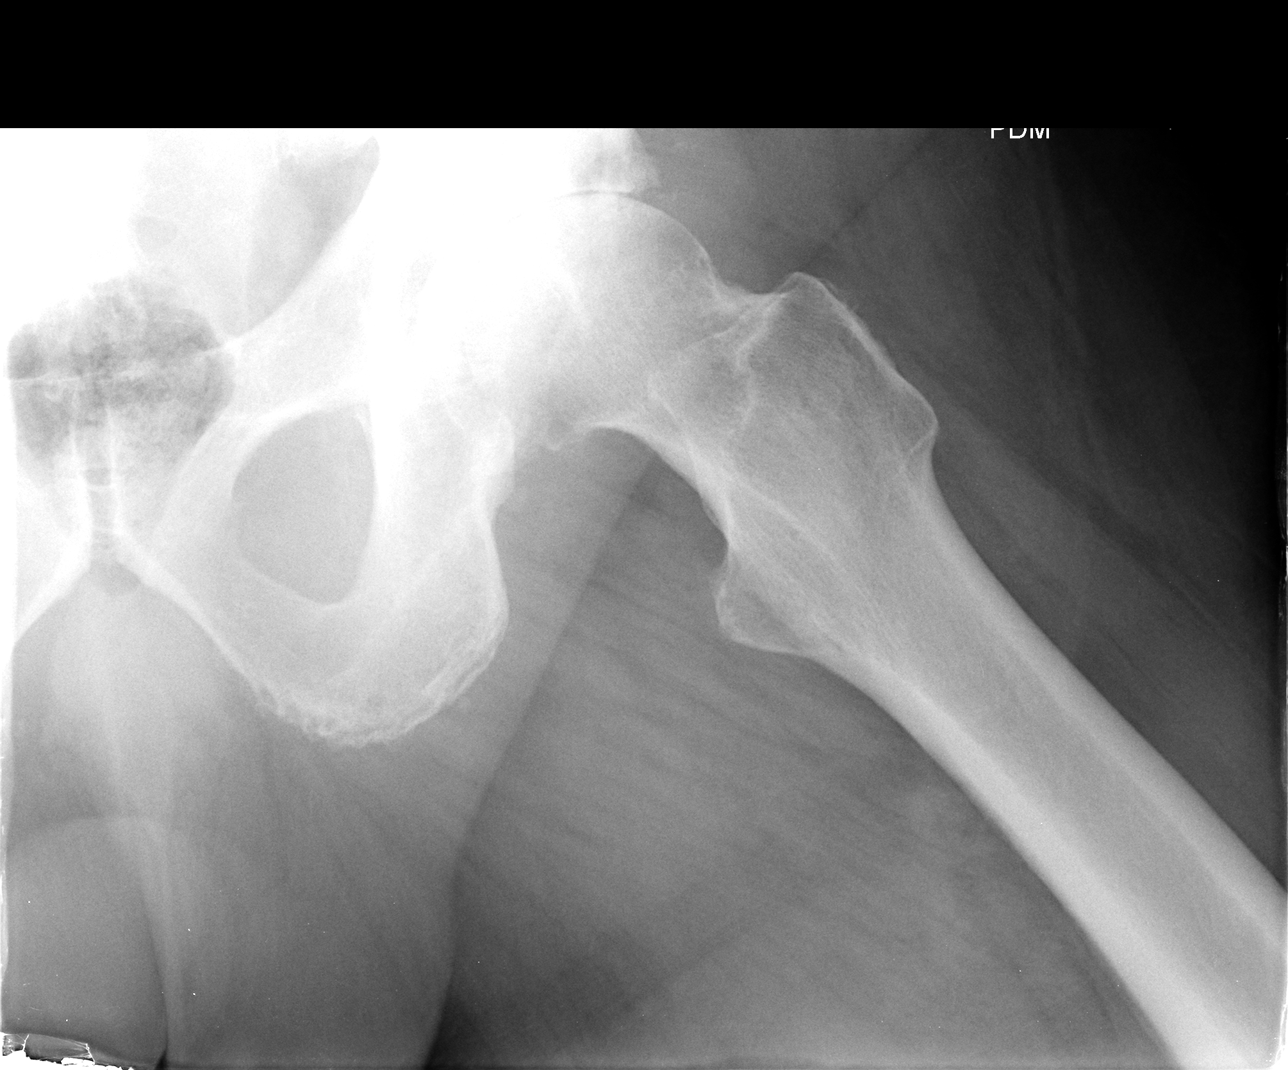

[3 of 3 positions shown; findings below may reference images not displayed]

FINDINGS: Advanced asymmetric degenerative changes are noted in the left hip.
There is near complete loss of joint space. Periarticular cystic
changes are again noted. No acute fracture is evident. The hip is
located. The visualized pelvis is otherwise within normal limits.
Joint space is preserved on the right.
IMPRESSION: 1. Advanced asymmetric degenerative hip changes on the left.
2. No acute abnormality.

## 2015-05-21 ENCOUNTER — Ambulatory Visit (HOSPITAL_COMMUNITY)
Admission: RE | Admit: 2015-05-21 | Discharge: 2015-05-21 | Disposition: A | Discharge status: Home / Self Care | Payer: Commercial Managed Care - HMO | Source: Ambulatory Visit | Attending: Nurse Practitioner | Admitting: Nurse Practitioner

## 2015-05-21 VITALS — BP 124/78 | HR 88 | Ht 70.0 in | Wt 243.0 lb

## 2015-05-21 DIAGNOSIS — I481 Persistent atrial fibrillation: Secondary | ICD-10-CM | POA: Insufficient documentation

## 2015-05-21 DIAGNOSIS — I4819 Other persistent atrial fibrillation: Secondary | ICD-10-CM

## 2015-05-23 ENCOUNTER — Encounter (HOSPITAL_COMMUNITY): Payer: Self-pay | Admitting: Nurse Practitioner

## 2015-05-23 NOTE — Progress Notes (Signed)
Patient ID: Damon Weaver, male   DOB: 12/24/1945, 69 y.o.   MRN: [id number]     Primary Care Physician: Damon LanceEHINGER,ROBERT R, MD Referring Physician: Dr. Orion ModestNasher   Damon Weaver is a 69 y.o. male with a h/o HTN, and persistent afib since 1/16, that is in  the afib clinic to discuss options to restore SR. Afib was discovered at the time of hip replacement this past January. He reports that he has always been very active when he was younger and was a long distance runner. However in his forties, he gave up running byut stayed very active. In 2014 started having back issues and went on to have back surgery in the fall of 2014. Therefore between his back issues and hip issues he is just now getting back to being active. Cardioverison was pursued in 3/16 however with very early return of SR.  He is for most part asymtomatic with afib and can only feel some irregularity in his rhythm if he pushes himself. He reports that he walked 7 miles the other day without issues. However, he would like to pursue restoring SR because he feels that he would be better off in the long run in SR, and it might boost his energy level. He does give history of snoring and his wife has reported in the past that he has had apneic episodes. He does  have daytime somnolence, can fall asleep easily during the day and has very fragmented sleep.He does also drink a half bottle of  wine a night. No smoking. No high use of caffeine. Is trying to be more active and is mindful of his weight. He is on xalrelto with a chadsvasc score of 2 for age and HTN. He does take this faithfully. No bleeding issues. Allergic reaction to eliquis in the past.  Today, he denies symptoms of palpitations, chest pain, shortness of breath, orthopnea, PND, lower extremity edema, dizziness, presyncope, syncope, or neurologic sequela. The patient is tolerating medications without difficulties and is otherwise without complaint today.   Past Medical History    Diagnosis Date  . Hypertension   . Numbness and tingling     Hx: of left leg  . Arthritis   . Dysrhythmia     Afib  . Seasonal allergies    Past Surgical History  Procedure Laterality Date  . Rotator cuff repair      Hx of right arm  . Colonoscopy w/ biopsies and polypectomy      Hx; of  . Lumbar laminectomy/decompression microdiscectomy N/A 03/24/2013    Procedure: LUMBAR LAMINECTOMY/DECOMPRESSION MICRODISCECTOMY LUMBAR THREE-FOUR,FOUR-FIVE;  Surgeon: [name], MD;  Location: [facility] NEUR[facility]S;  Service: Neurosurgery;  Laterality: N/A;  . Total hip arthroplasty Left 06/27/2014    DR MURPHY  . Total hip arthroplasty Left 06/27/2014    Procedure: LEFT TOTAL HIP ARTHROPLASTY ANTERIOR APPROACH;  Surgeon: [name], MD;  Location: [facility] OR;  Service: Orthopedics;  Laterality: Left;  . Cardioversion N/A 08/30/2014    Procedure: CARDIOVERSION;  Surgeon: Vesta MixerPhilip J Nahser, MD;  [facility];  Service: Cardiovascular;  Laterality: N/A;    Current Outpatient Prescriptions  Medication Sig Dispense Refill  . acetaminophen (TYLENOL) 500 MG tablet Take 500-1,000 mg by mouth every 6 (six) hours as needed (pain).    Marland Kitchen. lisinopril (PRINIVIL,ZESTRIL) 20 MG tablet Take 1 tablet (20 mg total) by mouth daily. 90 tablet 3  . metoprolol tartrate (LOPRESSOR) 25 MG tablet Take 1 tablet (25 mg total) by  mouth 2 (two) times daily. 180 tablet 3  . pravastatin (PRAVACHOL) 40 MG tablet Take 40 mg by mouth at bedtime.     . rivaroxaban (XARELTO) 20 MG TABS tablet Take 1 tablet (20 mg total) by mouth daily with supper. 90 tablet 3   No current facility-administered medications for this encounter.    Allergies  Allergen Reactions  . Eliquis [Apixaban] Rash    Social History   Social History  . Marital Status: Married    Spouse Name: N/A  . Number of Children: N/A  . Years of Education: N/A   Occupational History  . Not on file.   Social History Main Topics  . Smoking status: Former  Smoker -- 2.00 packs/day for 16 years    Quit date: 09/12/1981  . Smokeless tobacco: Never Used  . Alcohol Use: Yes     Comment: daily  . Drug Use: No  . Sexual Activity: Not on file   Other Topics Concern  . Not on file   Social History Narrative    Family History  Problem Relation Age of Onset  . Arthritis Other     ROS- All systems are reviewed and negative except as per the HPI above  Physical Exam: Filed Vitals:   05/21/15 1441  BP: 124/78  Pulse: 88  Height:  (1.778 m)  Weight: 243 lb (110.224 kg)    GEN- The patient is well appearing, alert and oriented x 3 today.   Head- normocephalic, atraumatic Eyes-  Sclera clear, conjunctiva pink Ears- hearing intact Oropharynx- clear Neck- supple, no JVP Lymph- no cervical lymphadenopathy Lungs- Clear to ausculation bilaterally, normal work of breathing Heart- Irregular rate and rhythm, no murmurs, rubs or gallops, PMI not laterally displaced GI- soft, NT, ND, + BS Extremities- no clubbing, cyanosis, or edema MS- no significant deformity or atrophy Skin- no rash or lesion Psych- euthymic mood, full affect Neuro- strength and sensation are intact  EKG-afib with IRBBB, 88 bpm, QRS int 92 ms, Qtc 438 ms Ekg from 03/22/13 when in SR with QTc Renal panel- 09/07/14- creat 0.86, Kt 4.2  Epic records reviewed  Echo- 06/07/14-Left ventricle: The cavity size was normal. There was mild focal basal hypertrophy of the septum. Systolic function was normal. The estimated ejection fraction was in the range of 55% to 60%. Wall motion was normal; there were no regional wall motion abnormalities. - Aortic valve: There was moderate regurgitation directed towards the mitral anterior leaflet. - Mitral valve: There was mild regurgitation. - Left atrium: The atrium was mildly dilated. - Right ventricle: The cavity size was mildly dilated. Wall thickness was normal. - Right atrium: The atrium was mildly  dilated. - Pulmonary arteries: Systolic pressure was mildly increased. PA peak pressure: 38 mm Hg (S).  Assessment and Plan: 1. Persistent minimally symptomatic afb Pr would like to pursue restoring SR Reviewed with Dr. Allred(EKG,Echo)would be ok to try flecainide(no history of CAD) but due to the length of time in afib he may require the strength of Tikosyn to restore rhytym. I feel that he is too young for amiodarone due to possible long term side effects. Pt states that he is very busy through the end of the year and will not have time to devote to possible hospitalization. He would like to call back in early January and discuss again.  Continue metoprolol/xarelto  2. HTN Stable Continue lisinopril  3. Lifestyle issues contributing to afib burden Encouraged reduction in alcohol intake to two drinks a  week Encouraged to continue regular walking routine He is willing to have a sleep study, because sleep apnea, if present, will undermine efforts to restore sinus rhythm  Will wait and hear from pt in 1/16 for next step in restoring SR when his schedule slows down  Lupita Leash C. Matthew Fol[facility] [address] [place], [place] [address] [phone]

## 2015-05-25 ENCOUNTER — Other Ambulatory Visit: Payer: Self-pay | Admitting: *Deleted

## 2015-05-25 DIAGNOSIS — I4891 Unspecified atrial fibrillation: Secondary | ICD-10-CM

## 2015-05-25 DIAGNOSIS — G4733 Obstructive sleep apnea (adult) (pediatric): Secondary | ICD-10-CM

## 2015-07-18 ENCOUNTER — Ambulatory Visit (HOSPITAL_BASED_OUTPATIENT_CLINIC_OR_DEPARTMENT_OTHER): Payer: Commercial Managed Care - HMO | Attending: Nurse Practitioner

## 2015-07-18 VITALS — Ht 69.0 in | Wt 245.0 lb

## 2015-07-18 DIAGNOSIS — Z7901 Long term (current) use of anticoagulants: Secondary | ICD-10-CM | POA: Diagnosis not present

## 2015-07-18 DIAGNOSIS — I4891 Unspecified atrial fibrillation: Secondary | ICD-10-CM | POA: Diagnosis not present

## 2015-07-18 DIAGNOSIS — Z79899 Other long term (current) drug therapy: Secondary | ICD-10-CM | POA: Diagnosis not present

## 2015-07-18 DIAGNOSIS — R0683 Snoring: Secondary | ICD-10-CM | POA: Insufficient documentation

## 2015-07-18 DIAGNOSIS — G4733 Obstructive sleep apnea (adult) (pediatric): Secondary | ICD-10-CM | POA: Insufficient documentation

## 2015-07-18 DIAGNOSIS — I493 Ventricular premature depolarization: Secondary | ICD-10-CM | POA: Insufficient documentation

## 2015-07-25 ENCOUNTER — Telehealth: Payer: Self-pay | Admitting: Cardiology

## 2015-07-25 ENCOUNTER — Encounter (HOSPITAL_BASED_OUTPATIENT_CLINIC_OR_DEPARTMENT_OTHER): Payer: Self-pay

## 2015-07-25 DIAGNOSIS — G4733 Obstructive sleep apnea (adult) (pediatric): Secondary | ICD-10-CM | POA: Insufficient documentation

## 2015-07-25 HISTORY — DX: Obstructive sleep apnea (adult) (pediatric): G47.33

## 2015-07-25 NOTE — Telephone Encounter (Signed)
Please let patient know that they have sleep apnea and recommend CPAP titration. Please set up titration in the sleep lab. 

## 2015-07-25 NOTE — Sleep Study (Signed)
   Patient Name: Damon Weaver, Damon Weaver MRN: [id number] Study Date: 07/18/2015 Gender: Male D.O.B: 02-28-1946 Age (years): 50 Referring Provider: Not Available Interpreting Physician: Armanda Magic MD, ABSM RPSGT: Melburn Popper  Height (inches): 69 BMI: 36 Weight (lbs): 245 Neck Size: 17.50  CLINICAL INFORMATION Sleep Study Type: NPSG Indication for sleep study: OSA Epworth Sleepiness Score: 6  SLEEP STUDY TECHNIQUE As per the AASM Manual for the Scoring of Sleep and Associated Events v2.3 (April 2016) with a hypopnea requiring 4% desaturations. The channels recorded and monitored were frontal, central and occipital EEG, electrooculogram (EOG), submentalis EMG (chin), nasal and oral airflow, thoracic and abdominal wall motion, anterior tibialis EMG, snore microphone, electrocardiogram, and pulse oximetry.  MEDICATIONS Patient's medications include: Lisinopril, Lopressor, Pravachol, Xarelto. Medications self-administered by patient during sleep study : No sleep medicine administered.  SLEEP ARCHITECTURE The study was initiated at 11:08:14 PM and ended at 5:09:48 AM. Sleep onset time was 8.1 minutes and the sleep efficiency was 85.5%. The total sleep time was 309.0 minutes. Stage REM latency was 88.0 minutes. The patient spent 9.06% of the night in stage N1 sleep, 66.34% in stage N2 sleep, 0.00% in stage N3 and 24.60% in REM. Alpha intrusion was absent. Supine sleep was 60.32%.  RESPIRATORY PARAMETERS The overall apnea/hypopnea index (AHI) was 8.2 per hour. There were 9 total apneas, including 9 obstructive, 0 central and 0 mixed apneas. There were 33 hypopneas and 8 RERAs. The AHI during Stage REM sleep was 21.3 per hour. AHI while supine was 8.7 per hour. The mean oxygen saturation was 94.64%. The minimum SpO2 during sleep was 78.00%. Moderate snoring was noted during this study.  CARDIAC DATA The 2 lead EKG demonstrated atrial fibrillation. The mean heart rate was 74.68 beats  per minute. Other EKG findings include: PVCs.  LEG MOVEMENT DATA The total PLMS were 859 with a resulting PLMS index of 166.82. Associated arousal with leg movement index was 3.3 .  IMPRESSIONS - Mild obstructive sleep apnea occurred during this study (AHI = 8.2/h). - No significant central sleep apnea occurred during this study (CAI = 0.0/h). - Moderate oxygen desaturation was noted during this study (Min O2 = 78.00%). - The patient snored with Moderate snoring volume. - EKG findings include atrial fibrillation and PVCs. - Severe periodic limb movements of sleep occurred during the study. No significant associated arousals.  DIAGNOSIS - Obstructive Sleep Apnea (327.23 [G47.33 ICD-10])  RECOMMENDATIONS - The patient has mild OSA overall but moderate during REM sleep with oxygen saturations as low as 78% and therefore recommend therapeutic CPAP titration to determine optimal pressure required to alleviate sleep disordered breathing. - Avoid alcohol, sedatives and other CNS depressants that may worsen sleep apnea and disrupt normal sleep architecture. - Sleep hygiene should be reviewed to assess factors that may improve sleep quality. - Weight management and regular exercise should be initiated or continued if appropriate.   Quintella Reichert Diplomate, American Board of Sleep Medicine  ELECTRONICALLY SIGNED ON:  07/25/2015, 3:45 PM [facility] PH: [phone]   FX: [phone] ACCREDITED BY THE AMERICAN ACADEMY OF SLEEP MEDICINE

## 2015-07-27 NOTE — Telephone Encounter (Signed)
Patient is aware of information. He stated that he is not sure that he is ready to proceed just yet. He asked that I call him back in a week to discuss this further.  I have added him to my list to call, and we will see how he would like to proceed.

## 2015-08-06 NOTE — Telephone Encounter (Signed)
Called patient to discuss and he stated that he did not want CPAP therapy.  He is understanding of the risks involved, as well as the benefits from using CPAP - he stated that this is not treatment that he is willing to do at this time.   I advised him to call me back if/when he changed his mind.

## 2015-08-20 DIAGNOSIS — G4733 Obstructive sleep apnea (adult) (pediatric): Secondary | ICD-10-CM | POA: Diagnosis not present

## 2015-08-20 DIAGNOSIS — I4891 Unspecified atrial fibrillation: Secondary | ICD-10-CM | POA: Diagnosis not present

## 2015-08-20 DIAGNOSIS — Z86010 Personal history of colonic polyps: Secondary | ICD-10-CM | POA: Diagnosis not present

## 2015-08-20 DIAGNOSIS — E7800 Pure hypercholesterolemia, unspecified: Secondary | ICD-10-CM | POA: Diagnosis not present

## 2015-08-20 DIAGNOSIS — E871 Hypo-osmolality and hyponatremia: Secondary | ICD-10-CM | POA: Diagnosis not present

## 2015-08-20 DIAGNOSIS — I10 Essential (primary) hypertension: Secondary | ICD-10-CM | POA: Diagnosis not present

## 2015-08-20 DIAGNOSIS — H9190 Unspecified hearing loss, unspecified ear: Secondary | ICD-10-CM | POA: Diagnosis not present

## 2015-08-20 DIAGNOSIS — Z792 Long term (current) use of antibiotics: Secondary | ICD-10-CM | POA: Diagnosis not present

## 2015-08-21 ENCOUNTER — Encounter: Payer: Self-pay | Admitting: Cardiovascular Disease

## 2015-09-26 ENCOUNTER — Other Ambulatory Visit: Payer: Self-pay | Admitting: Cardiovascular Disease

## 2015-10-05 ENCOUNTER — Other Ambulatory Visit: Payer: Self-pay | Admitting: *Deleted

## 2015-10-05 MED ORDER — LISINOPRIL 20 MG PO TABS: 20.0000 mg | ORAL_TABLET | Freq: Every day | ORAL | Status: DC

## 2015-10-24 ENCOUNTER — Ambulatory Visit (INDEPENDENT_AMBULATORY_CARE_PROVIDER_SITE_OTHER): Payer: Commercial Managed Care - HMO | Admitting: Cardiovascular Disease

## 2015-10-24 ENCOUNTER — Encounter: Payer: Self-pay | Admitting: Cardiovascular Disease

## 2015-10-24 VITALS — BP 110/80 | HR 80 | Ht 69.0 in | Wt 243.4 lb

## 2015-10-24 DIAGNOSIS — I4819 Other persistent atrial fibrillation: Secondary | ICD-10-CM

## 2015-10-24 DIAGNOSIS — I10 Essential (primary) hypertension: Secondary | ICD-10-CM | POA: Diagnosis not present

## 2015-10-24 DIAGNOSIS — I481 Persistent atrial fibrillation: Secondary | ICD-10-CM | POA: Diagnosis not present

## 2015-10-24 NOTE — Progress Notes (Signed)
Cardiology Office Note   Date:  10/24/2015   ID:  [name], DOB 08/20/1945, MRN [id number]  PCP:  Thora Lance, MD  Cardiologist:   Vesta Mixer, MD   Problem List  1. Atrial fib- CHADS2VASC score of 2 ( age,  HTN)  2. Hypertension 3. Hyperlipidemia 4. Aortic Insufficiency  - Moderate  AI .  5. Mild MR:    No chief complaint on file.     History of Present Illness: Damon Weaver is a 70 y.o. male who presents for follow up of his atrial fib We increased his coreg during his last visit.  He had a drug rash previously that has resolved when we switch from Eliquis to Xarelto.   He has had a left hip replacement since i last saw him Still has lots of pain. Has had lots of diffuse swelling in his left leg  He was told that this is normal following hip replacement.  No CP , no dyspnea.  No dizziness.   He was late taking a Xarelto dose several days ago  August 22, 2014:  Damon Weaver is doing ok.  Tolerating the afib well. Ready for cardioversion.  Has been taking the Xarelto. Has had persistent swelling in his left leg following hip replacement.  Venous dopplers were negative for DVT  October 10, 2014:  Damon Weaver is seen in follow up today . Had cardioversion last month.  Still feels well.    Nov. 4, 2016:  Damon Weaver is seen today for follow up of his atrial fib.  Has been cardioverted in the past.  Went back into atrial fib after a couple of days  Still fairly asymptomatic. Notices a bit more fatigue but otherwise is doing ok Trying to walk more .  Has some shortness of breath walking up a long hill.   Oct 24, 2015: Doing well Mild DOE with walking  Is working out more    Past Medical History  Diagnosis Date  . Hypertension   . Numbness and tingling     Hx: of left leg  . Arthritis   . Dysrhythmia     Afib  . Seasonal allergies   . OSA (obstructive sleep apnea) 07/25/2015     Mild OSA overall with AHI 8.2/hr and moderate during REM sleep with AHI 21/hr     Past Surgical History  Procedure Laterality Date  . Rotator cuff repair      Hx of right arm  . Colonoscopy w/ biopsies and polypectomy      Hx; of  . Lumbar laminectomy/decompression microdiscectomy N/A 03/24/2013    Procedure: LUMBAR LAMINECTOMY/DECOMPRESSION MICRODISCECTOMY LUMBAR THREE-FOUR,FOUR-FIVE;  Surgeon: Tia Alert, MD;  Location: [facility]URO [facility]S;  Service: Neurosurgery;  Laterality: N/A;  . Total hip arthroplasty Left 06/27/2014    DR MURPHY  . Total hip arthroplasty Left 06/27/2014    Procedure: LEFT TOTAL HIP ARTHROPLASTY ANTERIOR APPROACH;  Surgeon: Sheral Apley, MD;  Location: [facility] OR;  Service: Orthopedics;  Laterality: Left;  . Cardioversion N/A 08/30/2014    Procedure: CARDIOVERSION;  Surgeon: Vesta Mixer, MD;  Location: [facility] ENDOSCOPY;  Service: Cardiovascular;  Laterality: N/A;     Current Outpatient Prescriptions  Medication Sig Dispense Refill  . lisinopril (PRINIVIL,ZESTRIL) 20 MG tablet Take 1 tablet (20 mg total) by mouth daily. 14 tablet 0  . metoprolol tartrate (LOPRESSOR) 25 MG tablet TAKE 1 TABLET TWICE DAILY (REPLACES COREG) 180 tablet 1  . pravastatin (PRAVACHOL) 40 MG tablet Take 40  mg by mouth at bedtime.     . rivaroxaban (XARELTO) 20 MG TABS tablet Take 1 tablet (20 mg total) by mouth daily with supper. 90 tablet 3   No current facility-administered medications for this visit.    Allergies:   Eliquis    Social History:  The patient  reports that he quit smoking about 34 years ago. He has never used smokeless tobacco. He reports that he drinks alcohol. He reports that he does not use illicit drugs.   Family History:  The patient's family history includes Arthritis in his other.    ROS:  Please see the history of present illness.    Review of Systems: Constitutional:  denies fever, chills, diaphoresis, appetite change and fatigue.  HEENT: denies photophobia, eye pain, redness, hearing loss, ear pain, congestion, sore throat, rhinorrhea,  sneezing, neck pain, neck stiffness and tinnitus.  Respiratory: denies SOB, DOE, cough, chest tightness, and wheezing.  Cardiovascular: denies chest pain, palpitations and leg swelling.  Gastrointestinal: denies nausea, vomiting, abdominal pain, diarrhea, constipation, blood in stool.  Genitourinary: denies dysuria, urgency, frequency, hematuria, flank pain and difficulty urinating.  Musculoskeletal: denies  myalgias, back pain, joint swelling, arthralgias and gait problem.   Skin: denies pallor, rash and wound.  Neurological: denies dizziness, seizures, syncope, weakness, light-headedness, numbness and headaches.   Hematological: denies adenopathy, easy bruising, personal or family bleeding history.  Psychiatric/ Behavioral: denies suicidal ideation, mood changes, confusion, nervousness, sleep disturbance and agitation.       All other systems are reviewed and negative.    PHYSICAL EXAM: VS:  BP 110/80 mmHg  Pulse 80  Ht 5\' 9"  (1.753 m)  Wt 243 lb 6.4 oz (110.406 kg)  BMI 35.93 kg/m2 , BMI Body mass index is 35.93 kg/(m^2). GEN: Well nourished, well developed, in no acute distress HEENT: normal Neck: no JVD, carotid bruits, or masses Cardiac: Irreg. Irreg. ; no murmurs, rubs, or gallops,no edema  Respiratory:  clear to auscultation bilaterally, normal work of breathing GI: soft, nontender, nondistended, + BS MS: his left leg is diffusely swollen  Skin: warm and dry, no rash Neuro:  Strength and sensation are intact Psych: normal   EKG:  EKG is not ordered today.    Recent Labs: No results found for requested labs within last 365 days.    Lipid Panel No results found for: CHOL, TRIG, HDL, CHOLHDL, VLDL, LDLCALC, LDLDIRECT    Wt Readings from Last 3 Encounters:  10/24/15 243 lb 6.4 oz (110.406 kg)  07/18/15 245 lb (111.131 kg)  05/21/15 243 lb (110.224 kg)      Other studies Reviewed: Additional studies/ records that were reviewed today include: . Review of the  above records demonstrates:    ASSESSMENT AND PLAN:  1. Atrial fib -  Has chronic Afib.  Failed cardioversion .   He is asymptomatic .    Will continue xarelto and metoprolol .   Rate is well-controlled. He's not having any bleeding,  Continue rate control and anticoagulation   2. Hypertension- BP is well controlled.  3. Hyperlipidemia -  Followed By his primary medical doctor.  4. Left hip replacement -    Current medicines are reviewed at length with the patient today.  The patient does not have concerns regarding medicines.  The following changes have been made:     Labs/ tests ordered today include:  No orders of the defined types were placed in this encounter.    Disposition:   FU with me in 6  months     Signed, Chirstopher Iovino, Deloris Ping, MD  10/24/2015 3:21 PM    [facility] [address] [place], [place], [place]  [address] Phone: [phone]; Fax: [phone]

## 2015-10-24 NOTE — Patient Instructions (Signed)

## 2015-11-22 IMAGING — CR DG PORTABLE PELVIS
2 series · 2 of 2 positions shown · non-contrast
Comparison: CT 03/30/2013

CLINICAL DATA: Left hip replacement.

EXAM:
PORTABLE PELVIS 1-2 VIEWS

[AP (1 of 2)]
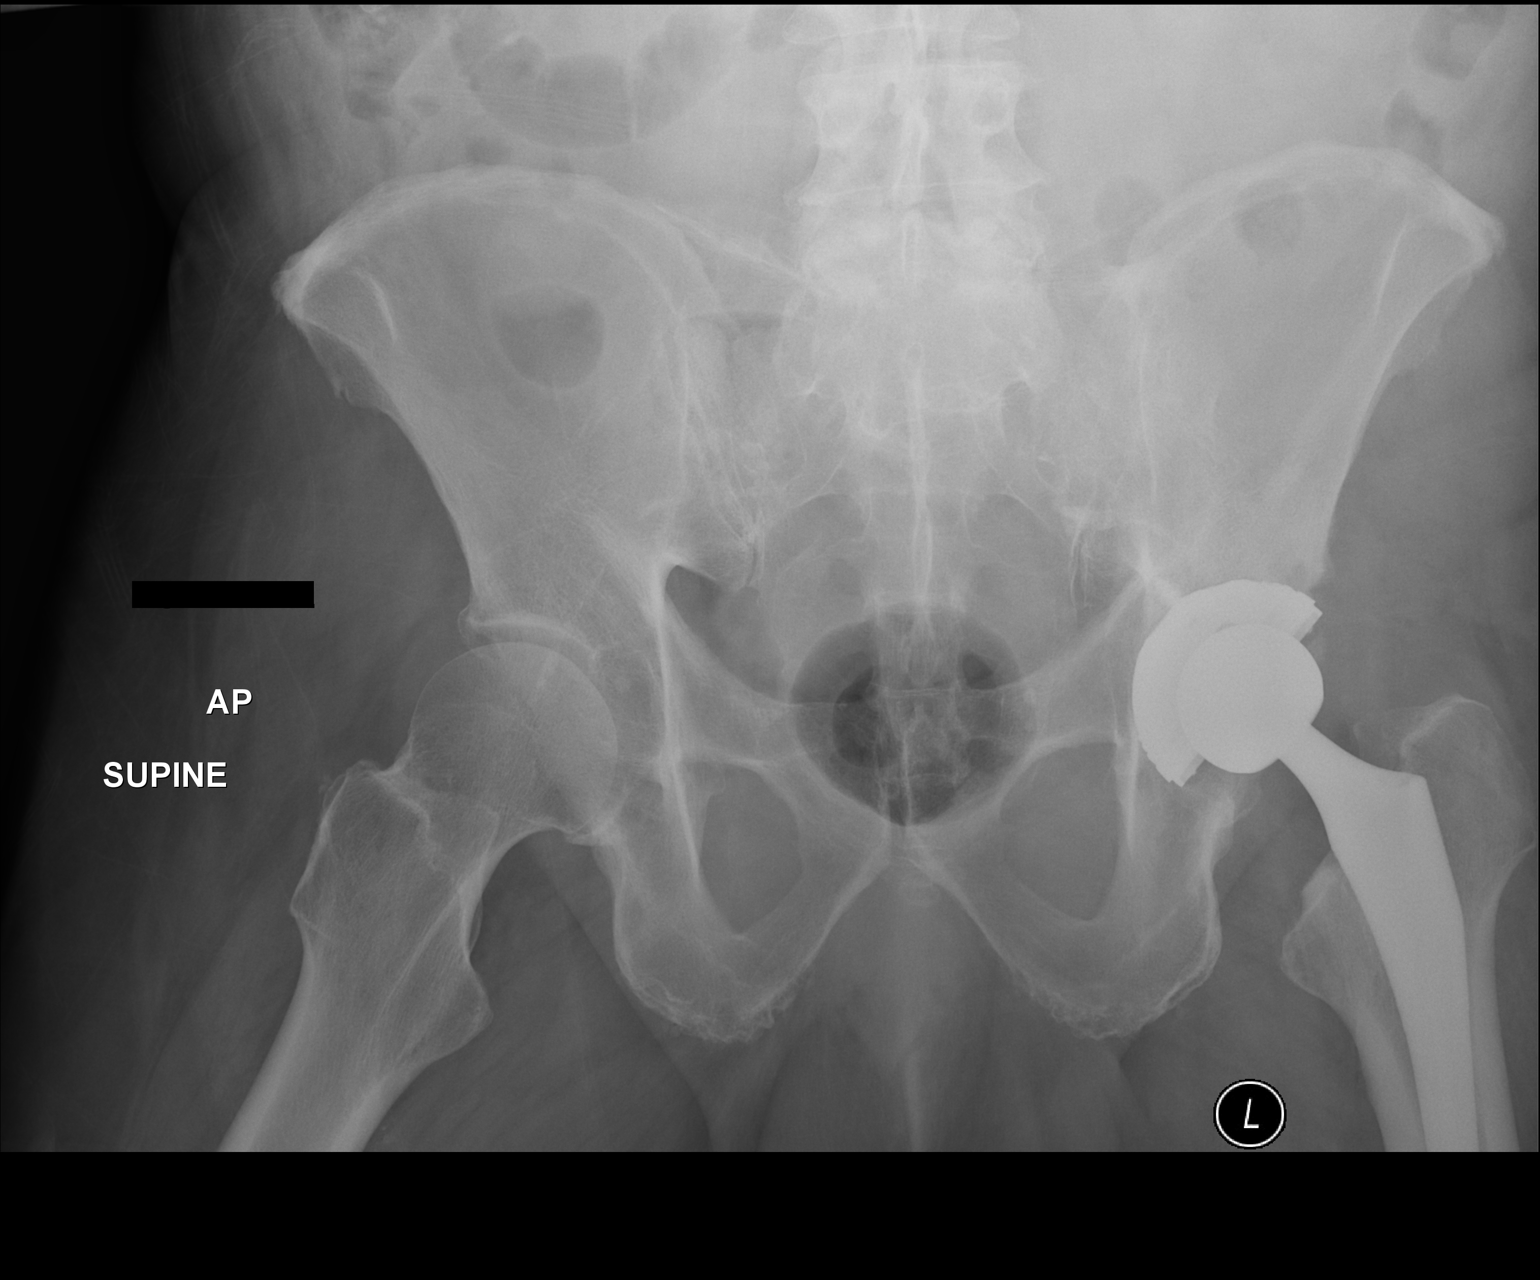

[AP (2 of 2)]
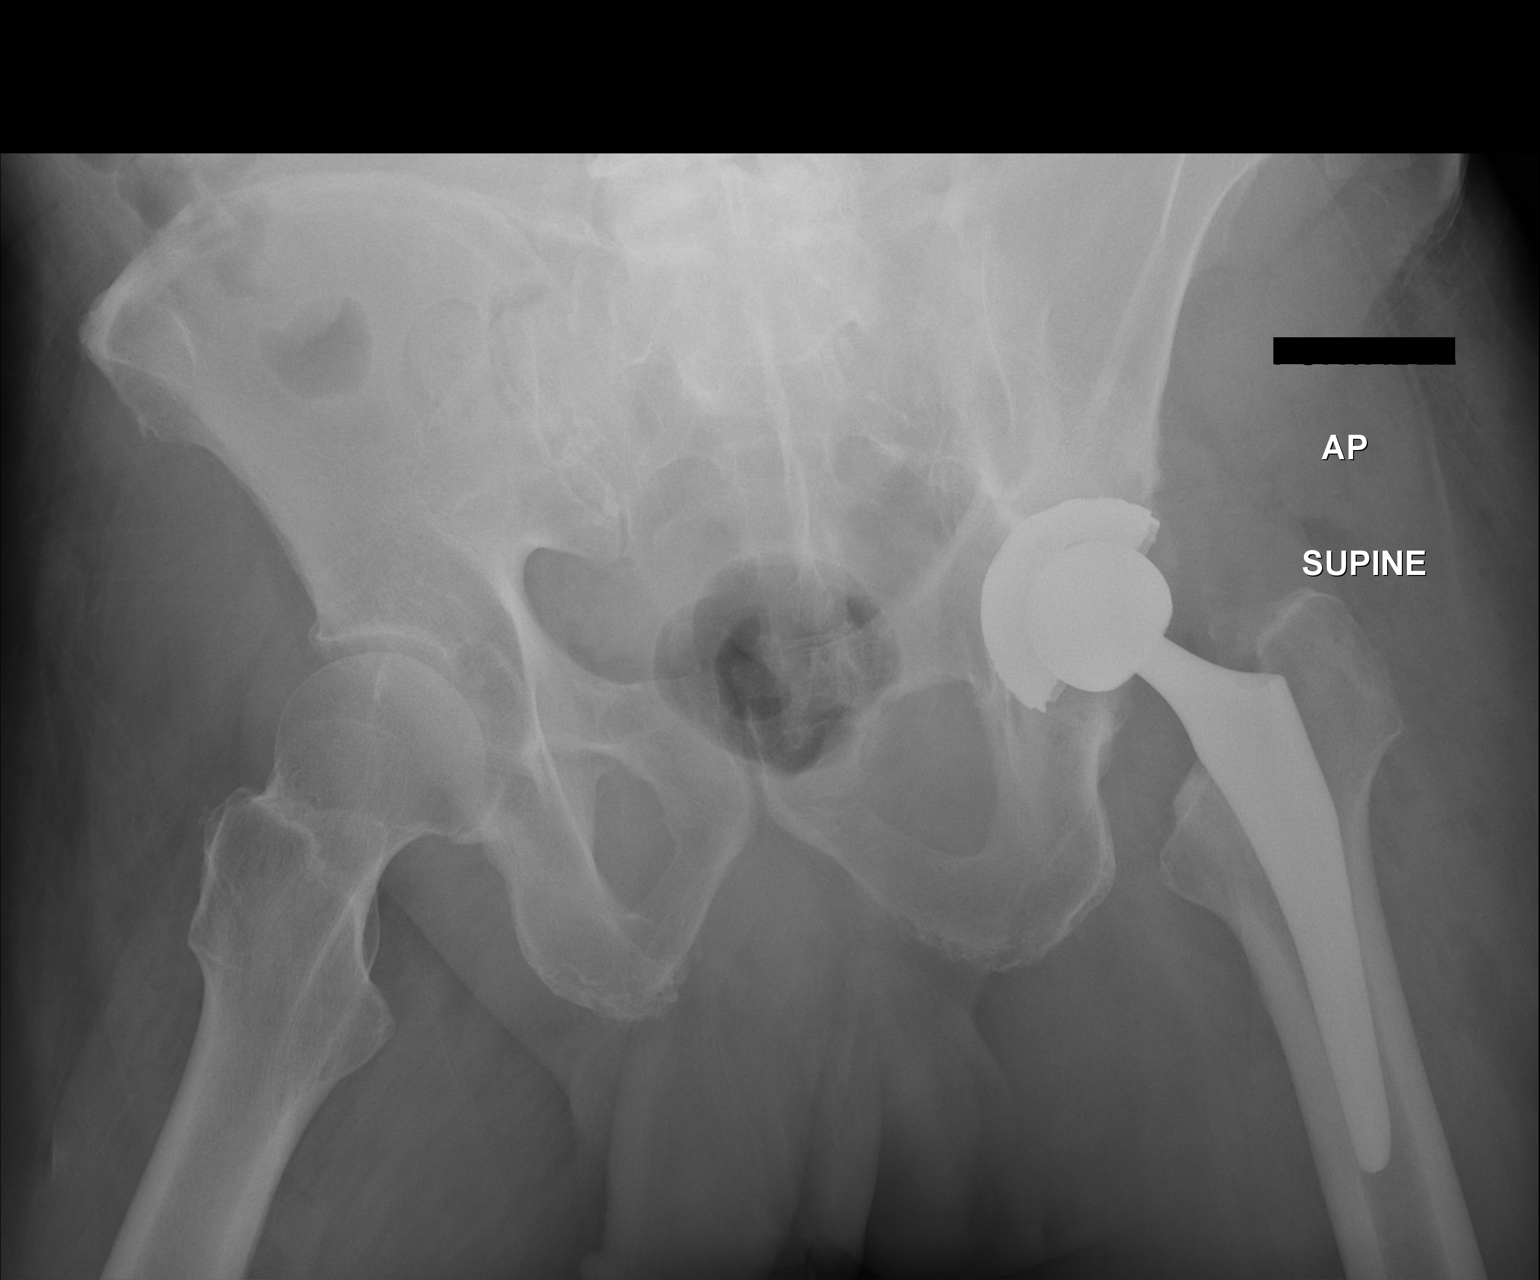

[2 of 2 positions shown; findings below may reference images not displayed]

FINDINGS: Examination demonstrates a total left hip arthroplasty intact and
normally located. No evidence of acute fracture dislocation. Very
minimal degenerative change of the right hip. Mild degenerative
change of the spine.
IMPRESSION: No acute findings.

Left total hip arthroplasty without complicating features.

## 2015-11-27 DIAGNOSIS — M5412 Radiculopathy, cervical region: Secondary | ICD-10-CM | POA: Diagnosis not present

## 2015-11-27 DIAGNOSIS — M25511 Pain in right shoulder: Secondary | ICD-10-CM | POA: Diagnosis not present

## 2016-01-01 ENCOUNTER — Other Ambulatory Visit: Payer: Self-pay | Admitting: Cardiovascular Disease

## 2016-01-08 DIAGNOSIS — M25511 Pain in right shoulder: Secondary | ICD-10-CM | POA: Diagnosis not present

## 2016-01-12 DIAGNOSIS — M25511 Pain in right shoulder: Secondary | ICD-10-CM | POA: Diagnosis not present

## 2016-01-23 DIAGNOSIS — M6281 Muscle weakness (generalized): Secondary | ICD-10-CM | POA: Diagnosis not present

## 2016-01-23 DIAGNOSIS — M7581 Other shoulder lesions, right shoulder: Secondary | ICD-10-CM | POA: Diagnosis not present

## 2016-01-23 DIAGNOSIS — M25511 Pain in right shoulder: Secondary | ICD-10-CM | POA: Diagnosis not present

## 2016-01-25 DIAGNOSIS — M7581 Other shoulder lesions, right shoulder: Secondary | ICD-10-CM | POA: Diagnosis not present

## 2016-01-25 DIAGNOSIS — M6281 Muscle weakness (generalized): Secondary | ICD-10-CM | POA: Diagnosis not present

## 2016-01-25 DIAGNOSIS — M25511 Pain in right shoulder: Secondary | ICD-10-CM | POA: Diagnosis not present

## 2016-01-29 DIAGNOSIS — M7581 Other shoulder lesions, right shoulder: Secondary | ICD-10-CM | POA: Diagnosis not present

## 2016-01-29 DIAGNOSIS — M6281 Muscle weakness (generalized): Secondary | ICD-10-CM | POA: Diagnosis not present

## 2016-01-29 DIAGNOSIS — M25511 Pain in right shoulder: Secondary | ICD-10-CM | POA: Diagnosis not present

## 2016-01-31 DIAGNOSIS — M25511 Pain in right shoulder: Secondary | ICD-10-CM | POA: Diagnosis not present

## 2016-01-31 DIAGNOSIS — M7581 Other shoulder lesions, right shoulder: Secondary | ICD-10-CM | POA: Diagnosis not present

## 2016-01-31 DIAGNOSIS — M6281 Muscle weakness (generalized): Secondary | ICD-10-CM | POA: Diagnosis not present

## 2016-02-12 DIAGNOSIS — M25511 Pain in right shoulder: Secondary | ICD-10-CM | POA: Diagnosis not present

## 2016-02-12 DIAGNOSIS — M6281 Muscle weakness (generalized): Secondary | ICD-10-CM | POA: Diagnosis not present

## 2016-02-12 DIAGNOSIS — M7581 Other shoulder lesions, right shoulder: Secondary | ICD-10-CM | POA: Diagnosis not present

## 2016-03-11 DIAGNOSIS — J0110 Acute frontal sinusitis, unspecified: Secondary | ICD-10-CM | POA: Diagnosis not present

## 2016-03-28 DIAGNOSIS — Z23 Encounter for immunization: Secondary | ICD-10-CM | POA: Diagnosis not present

## 2016-06-05 DIAGNOSIS — H9190 Unspecified hearing loss, unspecified ear: Secondary | ICD-10-CM | POA: Diagnosis not present

## 2016-06-05 DIAGNOSIS — Z125 Encounter for screening for malignant neoplasm of prostate: Secondary | ICD-10-CM | POA: Diagnosis not present

## 2016-06-05 DIAGNOSIS — Z792 Long term (current) use of antibiotics: Secondary | ICD-10-CM | POA: Diagnosis not present

## 2016-06-05 DIAGNOSIS — Z86010 Personal history of colonic polyps: Secondary | ICD-10-CM | POA: Diagnosis not present

## 2016-06-05 DIAGNOSIS — E7800 Pure hypercholesterolemia, unspecified: Secondary | ICD-10-CM | POA: Diagnosis not present

## 2016-06-05 DIAGNOSIS — I10 Essential (primary) hypertension: Secondary | ICD-10-CM | POA: Diagnosis not present

## 2016-06-05 DIAGNOSIS — E871 Hypo-osmolality and hyponatremia: Secondary | ICD-10-CM | POA: Diagnosis not present

## 2016-06-05 DIAGNOSIS — I4891 Unspecified atrial fibrillation: Secondary | ICD-10-CM | POA: Diagnosis not present

## 2016-06-05 DIAGNOSIS — Z0000 Encounter for general adult medical examination without abnormal findings: Secondary | ICD-10-CM | POA: Diagnosis not present

## 2016-06-24 ENCOUNTER — Other Ambulatory Visit: Payer: Self-pay | Admitting: Cardiovascular Disease

## 2016-08-16 ENCOUNTER — Other Ambulatory Visit: Payer: Self-pay | Admitting: Cardiovascular Disease

## 2016-10-20 ENCOUNTER — Other Ambulatory Visit: Payer: Self-pay | Admitting: Cardiovascular Disease

## 2016-11-10 ENCOUNTER — Other Ambulatory Visit: Payer: Self-pay | Admitting: Cardiovascular Disease

## 2016-12-02 ENCOUNTER — Ambulatory Visit: Payer: Commercial Managed Care - HMO | Admitting: Physician Assistant

## 2016-12-03 ENCOUNTER — Encounter: Payer: Self-pay | Admitting: Physician Assistant

## 2016-12-03 ENCOUNTER — Ambulatory Visit (INDEPENDENT_AMBULATORY_CARE_PROVIDER_SITE_OTHER): Payer: Medicare HMO | Admitting: Physician Assistant

## 2016-12-03 VITALS — BP 140/90 | HR 81 | Ht 69.0 in | Wt 255.4 lb

## 2016-12-03 DIAGNOSIS — I10 Essential (primary) hypertension: Secondary | ICD-10-CM

## 2016-12-03 DIAGNOSIS — I4819 Other persistent atrial fibrillation: Secondary | ICD-10-CM

## 2016-12-03 DIAGNOSIS — I481 Persistent atrial fibrillation: Secondary | ICD-10-CM

## 2016-12-03 MED ORDER — LISINOPRIL 20 MG PO TABS: 20.0000 mg | ORAL_TABLET | Freq: Every day | ORAL | 3 refills | Status: DC

## 2016-12-03 MED ORDER — RIVAROXABAN 20 MG PO TABS: ORAL_TABLET | ORAL | 3 refills | Status: DC

## 2016-12-03 MED ORDER — METOPROLOL TARTRATE 25 MG PO TABS: 25.0000 mg | ORAL_TABLET | Freq: Two times a day (BID) | ORAL | 3 refills | Status: DC

## 2016-12-03 NOTE — Patient Instructions (Addendum)
Medication Instructions:  No changes.   Labwork: Today - BMET, CBC  Testing/Procedures: None   Follow-Up: Your physician wants you to follow-up in: 1 YEAR WITH DR. Elease HashimotoNAHSER. You will receive a reminder letter in the mail two months in advance. If you don't receive a letter, please call our office to schedule the follow-up appointment.   Any Other Special Instructions Will Be Listed Below (If Applicable). Please try to check your blood pressure a few times over the next 2 weeks.  If it is greater than 140/90, call Blair HeysEhinger, Robert, MD or us your medication can be adjusted.  If you need a refill on your cardiac medications before your next appointment, please call your pharmacy.

## 2016-12-03 NOTE — Progress Notes (Signed)
Cardiology Office Note:    Date:  12/03/2016   ID:  Damon Weaver, DOB 10/30/1945, MRN [id number]  PCP:  Damon HeysEhinger, Robert, MD  Cardiologist:  Dr. Delane GingerPhil Weaver    Referring MD: Damon HeysEhinger, Robert, MD   Chief Complaint  Patient presents with  . Atrial Fibrillation    Follow-up    History of Present Illness:    Damon Weaver is a 71 y.o. male with a hx of persistent atrial fibrillation, HTN, obstructive sleep apnea.  He underwent cardioversion in 2016 but went back into atrial fibrillation. He was seen in the atrial fibrillation clinic last year and antiarrhythmic drug therapy was discussed but the patient deferred proceeding at that time. He was last seen by Dr. Elease HashimotoNahser 10/2015. Patient was felt to be asymptomatic and rate control strategy was recommended at that time.  Of note, he has declined treatment of obstructive sleep apnea with CPAP.    CHADS2-VASc=2 (HTN, 71 yo).    Mr. Verdon Weaver returns for cardiology follow-up. He is doing well. He gets out of breath if he climbs 2 flights of stairs. He can generally walk a few miles without difficulty. He has not had chest discomfort. He denies syncope. He denies orthopnea, PND or edema. He denies any bleeding issues.  Prior CV studies:   The following studies were reviewed today:  Echo 06/07/14 Mild focal basal septal hypertrophy, EF 55-60, normal wall motion, moderate AI, mild MR, mild LAE, mild RAE, PASP 38  Past Medical History:  Diagnosis Date  . Arthritis   . Dysrhythmia    Afib  . Hypertension   . Numbness and tingling    Hx: of left leg  . OSA (obstructive sleep apnea) 07/25/2015    Mild OSA overall with AHI 8.2/hr and moderate during REM sleep with AHI 21/hr  . Seasonal allergies     Past Surgical History:  Procedure Laterality Date  . CARDIOVERSION N/A 08/30/2014   Procedure: CARDIOVERSION;  Surgeon: Damon MixerPhilip J Nahser, MD;  [facility];  Service: Cardiovascular;  Laterality: N/A;  . COLONOSCOPY W/ BIOPSIES AND  POLYPECTOMY     Hx; of  . LUMBAR LAMINECTOMY/DECOMPRESSION MICRODISCECTOMY N/A 03/24/2013   Procedure: LUMBAR LAMINECTOMY/DECOMPRESSION MICRODISCECTOMY LUMBAR THREE-FOUR,FOUR-FIVE;  Surgeon: Damon Alertavid S Jones, MD;  Location: [facility] NEURO ORS;  Service: Neurosurgery;  Laterality: N/A;  . ROTATOR CUFF REPAIR     Hx of right arm  . TOTAL HIP ARTHROPLASTY Left 06/27/2014   DR Weaver  . TOTAL HIP ARTHROPLASTY Left 06/27/2014   Procedure: LEFT TOTAL HIP ARTHROPLASTY ANTERIOR APPROACH;  Surgeon: Damon Apleyimothy D Murphy, MD;  Location: [facility] OR;  Service: Orthopedics;  Laterality: Left;    Current Medications: Current Meds  Medication Sig  . lisinopril (PRINIVIL,ZESTRIL) 20 MG tablet Take 1 tablet (20 mg total) by mouth daily.  . metoprolol tartrate (LOPRESSOR) 25 MG tablet Take 1 tablet (25 mg total) by mouth 2 (two) times daily.  . pravastatin (PRAVACHOL) 40 MG tablet Take 40 mg by mouth at bedtime.   . rivaroxaban (XARELTO) 20 MG TABS tablet TAKE 1 TABLET EVERY DAY WITH SUPPER     Allergies:   Eliquis [apixaban]   Social History   Social History  . Marital status: Married    Spouse name: N/A  . Number of children: N/A  . Years of education: N/A   Social History Main Topics  . Smoking status: Former Smoker    Packs/day: 2.00    Years: 16.00    Quit date: 09/12/1981  . Smokeless  tobacco: Never Used  . Alcohol use Yes     Comment: daily  . Drug use: No  . Sexual activity: Not Asked   Other Topics Concern  . None   Social History Narrative  . None     Family Hx: The patient's family history includes Arthritis in his other.  ROS:   Please see the history of present illness.    ROS All other systems reviewed and are negative.   EKGs/Labs/Other Test Reviewed:    EKG:  EKG is  ordered today.  The ekg ordered today demonstrates Atrial fibrillation, HR 81, normal axis  Recent Labs: No results found for requested labs within last 8760 hours.   Recent Lipid Panel No results found for: CHOL,  TRIG, HDL, CHOLHDL, LDLCALC, LDLDIRECT  Physical Exam:    VS:  BP 140/90   Pulse 81   Ht 5\' 9"  (1.753 m)   Wt 255 lb 6.4 oz (115.8 kg)   BMI 37.72 kg/m     Wt Readings from Last 3 Encounters:  12/03/16 255 lb 6.4 oz (115.8 kg)  10/24/15 243 lb 6.4 oz (110.4 kg)  07/18/15 245 lb (111.1 kg)     Physical Exam  Constitutional: He is oriented to person, place, and time. He appears well-developed and well-nourished. No distress.  HENT:  Head: Normocephalic and atraumatic.  Eyes: No scleral icterus.  Neck: Normal range of motion. No JVD present. Carotid bruit is not present.  Cardiovascular: Normal rate, S1 normal and S2 normal.  An irregularly irregular rhythm present.  No murmur heard. Pulmonary/Chest: Effort normal and breath sounds normal. He has no wheezes. He has no rhonchi. He has no rales.  Abdominal: Soft. There is no tenderness.  Musculoskeletal: He exhibits edema (trace-1+ bilateral ankle edema).  Neurological: He is alert and oriented to person, place, and time.  Skin: Skin is warm and dry.  Psychiatric: He has a normal mood and affect.    ASSESSMENT:    1. Persistent atrial fibrillation (HCC)   2. Essential hypertension    PLAN:    In order of problems listed above:  1. Persistent atrial fibrillation (HCC) -  He remains in atrial fibrillation. His rate is controlled. He remains asymptomatic. We discussed the rationale for rate control versus rhythm control strategy. As he remains asymptomatic, we should continue to pursue a rate control strategy. Continue metoprolol, rivaroxaban. Obtain follow-up BMET, CBC.  2. Essential hypertension -  His blood pressure is elevated today. Previously it has been optimal. I have asked him to continue to monitor his blood pressure. If it remains greater than 140/90, he will contact his PCP or [place] for medication adjustments.  Dispo:  Return in about 1 year (around 12/03/2017) for Routine Follow Up, w/ Dr. Elease Hashimoto.   Medication  Adjustments/Labs and Tests Ordered: Current medicines are reviewed at length with the patient today.  Concerns regarding medicines are outlined above.  Orders/Tests:  Orders Placed This Encounter  Procedures  . Basic Metabolic Panel (BMET)  . CBC  . EKG 12-Lead   Medication changes: Meds ordered this encounter  Medications  . lisinopril (PRINIVIL,ZESTRIL) 20 MG tablet    Sig: Take 1 tablet (20 mg total) by mouth daily.    Dispense:  90 tablet    Refill:  3  . metoprolol tartrate (LOPRESSOR) 25 MG tablet    Sig: Take 1 tablet (25 mg total) by mouth 2 (two) times daily.    Dispense:  180 tablet    Refill:  3  . rivaroxaban (XARELTO) 20 MG TABS tablet    Sig: TAKE 1 TABLET EVERY DAY WITH SUPPER    Dispense:  90 tablet    Refill:  3   Signed, Tereso Newcomer, PA-C  12/03/2016 4:26 PM    [facility] [address] [place], [place], [place]  [address] Phone: [phone]; Fax: [phone]

## 2016-12-04 LAB — BASIC METABOLIC PANEL
BUN/Creatinine Ratio: 15 (ref 10–24)
BUN: 13 mg/dL (ref 8–27)
CALCIUM: 9.1 mg/dL (ref 8.6–10.2)
CHLORIDE: 99 mmol/L (ref 96–106)
CO2: 25 mmol/L (ref 20–29)
Creatinine, Ser: 0.87 mg/dL (ref 0.76–1.27)
GFR calc Af Amer: 103 mL/min/{1.73_m2} (ref >59)
GFR calc non Af Amer: 89 mL/min/{1.73_m2} (ref >59)
Glucose: 83 mg/dL (ref 65–99)
POTASSIUM: 4.6 mmol/L (ref 3.5–5.2)
SODIUM: 135 mmol/L (ref 134–144)

## 2016-12-04 LAB — CBC
HEMOGLOBIN: 14.4 g/dL (ref 13.0–17.7)
Hematocrit: 43.1 % (ref 37.5–51.0)
MCH: 32.1 pg (ref 26.6–33.0)
MCHC: 33.4 g/dL (ref 31.5–35.7)
MCV: 96 fL (ref 79–97)
PLATELETS: 178 10*3/uL (ref 150–379)
RBC: 4.49 x10E6/uL (ref 4.14–5.80)
RDW: 13.1 % (ref 12.3–15.4)
WBC: 6 10*3/uL (ref 3.4–10.8)

## 2016-12-17 DIAGNOSIS — H6693 Otitis media, unspecified, bilateral: Secondary | ICD-10-CM | POA: Diagnosis not present

## 2016-12-17 DIAGNOSIS — I10 Essential (primary) hypertension: Secondary | ICD-10-CM | POA: Diagnosis not present

## 2017-01-08 DIAGNOSIS — I10 Essential (primary) hypertension: Secondary | ICD-10-CM | POA: Diagnosis not present

## 2017-01-08 DIAGNOSIS — H9390 Unspecified disorder of ear, unspecified ear: Secondary | ICD-10-CM | POA: Diagnosis not present

## 2017-02-27 DIAGNOSIS — H9193 Unspecified hearing loss, bilateral: Secondary | ICD-10-CM | POA: Diagnosis not present

## 2017-02-27 DIAGNOSIS — I10 Essential (primary) hypertension: Secondary | ICD-10-CM | POA: Diagnosis not present

## 2017-02-27 DIAGNOSIS — B369 Superficial mycosis, unspecified: Secondary | ICD-10-CM | POA: Diagnosis not present

## 2017-02-27 DIAGNOSIS — H6123 Impacted cerumen, bilateral: Secondary | ICD-10-CM | POA: Diagnosis not present

## 2017-02-27 DIAGNOSIS — Z974 Presence of external hearing-aid: Secondary | ICD-10-CM | POA: Diagnosis not present

## 2017-02-27 DIAGNOSIS — H6243 Otitis externa in other diseases classified elsewhere, bilateral: Secondary | ICD-10-CM | POA: Diagnosis not present

## 2017-02-27 DIAGNOSIS — Z7289 Other problems related to lifestyle: Secondary | ICD-10-CM | POA: Diagnosis not present

## 2017-02-27 DIAGNOSIS — Z87891 Personal history of nicotine dependence: Secondary | ICD-10-CM | POA: Diagnosis not present

## 2017-03-23 DIAGNOSIS — H9193 Unspecified hearing loss, bilateral: Secondary | ICD-10-CM | POA: Diagnosis not present

## 2017-03-23 DIAGNOSIS — Z87891 Personal history of nicotine dependence: Secondary | ICD-10-CM | POA: Diagnosis not present

## 2017-03-23 DIAGNOSIS — B369 Superficial mycosis, unspecified: Secondary | ICD-10-CM | POA: Diagnosis not present

## 2017-03-23 DIAGNOSIS — H6243 Otitis externa in other diseases classified elsewhere, bilateral: Secondary | ICD-10-CM | POA: Diagnosis not present

## 2017-03-23 DIAGNOSIS — H6123 Impacted cerumen, bilateral: Secondary | ICD-10-CM | POA: Diagnosis not present

## 2017-03-23 DIAGNOSIS — Z7289 Other problems related to lifestyle: Secondary | ICD-10-CM | POA: Diagnosis not present

## 2017-03-25 DIAGNOSIS — Z23 Encounter for immunization: Secondary | ICD-10-CM | POA: Diagnosis not present

## 2017-06-25 DIAGNOSIS — I10 Essential (primary) hypertension: Secondary | ICD-10-CM | POA: Diagnosis not present

## 2017-06-25 DIAGNOSIS — Z792 Long term (current) use of antibiotics: Secondary | ICD-10-CM | POA: Diagnosis not present

## 2017-06-25 DIAGNOSIS — Z86010 Personal history of colonic polyps: Secondary | ICD-10-CM | POA: Diagnosis not present

## 2017-06-25 DIAGNOSIS — E7800 Pure hypercholesterolemia, unspecified: Secondary | ICD-10-CM | POA: Diagnosis not present

## 2017-06-25 DIAGNOSIS — H9190 Unspecified hearing loss, unspecified ear: Secondary | ICD-10-CM | POA: Diagnosis not present

## 2017-06-25 DIAGNOSIS — Z0000 Encounter for general adult medical examination without abnormal findings: Secondary | ICD-10-CM | POA: Diagnosis not present

## 2017-06-25 DIAGNOSIS — I4891 Unspecified atrial fibrillation: Secondary | ICD-10-CM | POA: Diagnosis not present

## 2017-06-25 DIAGNOSIS — Z1159 Encounter for screening for other viral diseases: Secondary | ICD-10-CM | POA: Diagnosis not present

## 2017-06-25 DIAGNOSIS — G4733 Obstructive sleep apnea (adult) (pediatric): Secondary | ICD-10-CM | POA: Diagnosis not present

## 2017-06-25 DIAGNOSIS — E871 Hypo-osmolality and hyponatremia: Secondary | ICD-10-CM | POA: Diagnosis not present

## 2017-07-16 ENCOUNTER — Telehealth: Payer: Self-pay | Admitting: Cardiovascular Disease

## 2017-07-16 NOTE — Telephone Encounter (Signed)
   [facility] Pre-operative Risk Assessment    Request for surgical clearance:  1. What type of surgery is being performed? Colonscopy  2. When is this surgery scheduled? January 29   3. What type of clearance is required (medical clearance vs. Pharmacy clearance to hold med vs. Both)? Both  4. Are there any medications that need to be held prior to surgery and how long?Xarelto stop date and restart date requested   5. Practice name and name of physician performing surgery? Dr. Richmond Campbell, [facility]   6. What is your office phone and fax number? Fax [phone] and Phone [phone]  7. Anesthesia type (None, local, MAC, general) ? Not indicated   Damon Weaver 07/16/2017, 2:08 PM  _________________________________________________________________   (provider comments below)

## 2017-07-16 NOTE — Telephone Encounter (Signed)
I reviewed Dr. Harvie BridgeNahser's advice with patient who verbalized understanding and agreement. He thanked me for the call.

## 2017-07-16 NOTE — Telephone Encounter (Signed)
Pt should take his last dose of Xarelto on Jan. 26 and may resume Xarelto on Jan. 30  He is at low risk for his colonoscopy.

## 2017-07-16 NOTE — Telephone Encounter (Signed)
Patient calling, would like to speak with you. °

## 2017-07-16 NOTE — Telephone Encounter (Signed)
Clearance faxed to [phone]

## 2017-07-17 DIAGNOSIS — H6123 Impacted cerumen, bilateral: Secondary | ICD-10-CM | POA: Diagnosis not present

## 2017-07-17 DIAGNOSIS — H903 Sensorineural hearing loss, bilateral: Secondary | ICD-10-CM | POA: Diagnosis not present

## 2017-07-17 DIAGNOSIS — H938X2 Other specified disorders of left ear: Secondary | ICD-10-CM | POA: Diagnosis not present

## 2017-07-20 DIAGNOSIS — Z9229 Personal history of other drug therapy: Secondary | ICD-10-CM | POA: Diagnosis not present

## 2017-07-21 DIAGNOSIS — K648 Other hemorrhoids: Secondary | ICD-10-CM | POA: Diagnosis not present

## 2017-07-21 DIAGNOSIS — Z86010 Personal history of colonic polyps: Secondary | ICD-10-CM | POA: Diagnosis not present

## 2017-07-21 DIAGNOSIS — K5730 Diverticulosis of large intestine without perforation or abscess without bleeding: Secondary | ICD-10-CM | POA: Diagnosis not present

## 2017-07-21 DIAGNOSIS — Z1211 Encounter for screening for malignant neoplasm of colon: Secondary | ICD-10-CM | POA: Diagnosis not present

## 2017-07-22 DIAGNOSIS — H903 Sensorineural hearing loss, bilateral: Secondary | ICD-10-CM | POA: Diagnosis not present

## 2017-09-25 ENCOUNTER — Other Ambulatory Visit: Payer: Self-pay | Admitting: Cardiovascular Disease

## 2017-11-12 ENCOUNTER — Other Ambulatory Visit: Payer: Self-pay | Admitting: Physician Assistant

## 2018-01-05 ENCOUNTER — Ambulatory Visit: Payer: Medicare HMO | Admitting: Cardiovascular Disease

## 2018-01-05 ENCOUNTER — Encounter: Payer: Self-pay | Admitting: Cardiovascular Disease

## 2018-01-05 VITALS — BP 138/82 | HR 81 | Ht 70.0 in | Wt 254.6 lb

## 2018-01-05 DIAGNOSIS — I351 Nonrheumatic aortic (valve) insufficiency: Secondary | ICD-10-CM

## 2018-01-05 DIAGNOSIS — R0609 Other forms of dyspnea: Secondary | ICD-10-CM | POA: Diagnosis not present

## 2018-01-05 DIAGNOSIS — I482 Chronic atrial fibrillation, unspecified: Secondary | ICD-10-CM

## 2018-01-05 MED ORDER — RIVAROXABAN 20 MG PO TABS: 20.0000 mg | ORAL_TABLET | Freq: Every day | ORAL | 3 refills | Status: AC

## 2018-01-05 NOTE — Patient Instructions (Signed)
Medication Instructions:  Your physician recommends that you continue on your current medications as directed. Please refer to the Current Medication list given to you today.   Labwork: None Ordered   Testing/Procedures: Your physician has requested that you have an echocardiogram. Echocardiography is a painless test that uses sound waves to create images of your heart. It provides your doctor with information about the size and shape of your heart and how well your heart's chambers and valves are working. This procedure takes approximately one hour. There are no restrictions for this procedure.   Follow-Up: Your physician wants you to follow-up in: 1 year with Dr. Nahser. You will receive a reminder letter in the mail two months in advance. If you don't receive a letter, please call our office to schedule the follow-up appointment.   If you need a refill on your cardiac medications before your next appointment, please call your pharmacy.   Thank you for choosing [facility]! Allisson Schindel, RN [phone]    

## 2018-01-05 NOTE — Progress Notes (Signed)
Cardiology Office Note   Date:  01/05/2018   ID:  Damon RichesDonald M Lindblad, DOB 10/30/1945, MRN [id number]  PCP:  Blair HeysEhinger, Robert, MD  Cardiologist:   Kristeen MissPhilip Eevie Lapp, MD   Problem List  1. Atrial fib- CHADS2VASC score of 2 ( age,  HTN)  2. Hypertension 3. Hyperlipidemia 4. Aortic Insufficiency  - Moderate  AI .  5. Mild MR:    Chief Complaint  Patient presents with  . Atrial Fibrillation      History of Present Illness: Damon Weaver is a 72 y.o. male who presents for follow up of his atrial fib We increased his coreg during his last visit.  He had a drug rash previously that has resolved when we switch from Eliquis to Xarelto.   He has had a left hip replacement since i last saw him Still has lots of pain. Has had lots of diffuse swelling in his left leg  He was told that this is normal following hip replacement.  No CP , no dyspnea.  No dizziness.   He was late taking a Xarelto dose several days ago  August 22, 2014:  [name] is doing ok.  Tolerating the afib well. Ready for cardioversion.  Has been taking the Xarelto. Has had persistent swelling in his left leg following hip replacement.  Venous dopplers were negative for DVT  October 10, 2014:  [name] is seen in follow up today . Had cardioversion last month.  Still feels well.    Nov. 4, 2016:  [name] is seen today for follow up of his atrial fib.  Has been cardioverted in the past.  Went back into atrial fib after a couple of days  Still fairly asymptomatic. Notices a bit more fatigue but otherwise is doing ok Trying to walk more .  Has some shortness of breath walking up a long hill.   Oct 24, 2015: Doing well Mild DOE with walking  Is working out more   January 05, 2018  Has mild DOE walking up a hill No DOE with normal walking  Not getting as much exercise as he would like   Past Medical History:  Diagnosis Date  . Arthritis   . Dysrhythmia    Afib  . Hypertension   . Numbness and tingling    Hx: of left leg    . OSA (obstructive sleep apnea) 07/25/2015    Mild OSA overall with AHI 8.2/hr and moderate during REM sleep with AHI 21/hr  . Seasonal allergies     Past Surgical History:  Procedure Laterality Date  . CARDIOVERSION N/A 08/30/2014   Procedure: CARDIOVERSION;  Surgeon: Vesta MixerPhilip J Radek Carnero, MD;  [facility];  Service: Cardiovascular;  Laterality: N/A;  . COLONOSCOPY W/ BIOPSIES AND POLYPECTOMY     Hx; of  . LUMBAR LAMINECTOMY/DECOMPRESSION MICRODISCECTOMY N/A 03/24/2013   Procedure: LUMBAR LAMINECTOMY/DECOMPRESSION MICRODISCECTOMY LUMBAR THREE-FOUR,FOUR-FIVE;  Surgeon: [name], MD;  Location: [facility];  Service: Neurosurgery;  Laterality: N/A;  . ROTATOR CUFF REPAIR     Hx of right arm  . TOTAL HIP ARTHROPLASTY Left 06/27/2014   DR MURPHY  . TOTAL HIP ARTHROPLASTY Left 06/27/2014   Procedure: LEFT TOTAL HIP ARTHROPLASTY ANTERIOR APPROACH;  Surgeon: [name], MD;  Location: [facility];  Service: Orthopedics;  Laterality: Left;     Current Outpatient Medications  Medication Sig Dispense Refill  . lisinopril (PRINIVIL,ZESTRIL) 20 MG tablet Take 1 tablet (20 mg total) by mouth daily. 90 tablet 3  . metoprolol  tartrate (LOPRESSOR) 25 MG tablet TAKE 1 TABLET (25 MG TOTAL) BY MOUTH 2 (TWO) TIMES DAILY. 180 tablet 0  . pravastatin (PRAVACHOL) 40 MG tablet Take 40 mg by mouth at bedtime.     . rivaroxaban (XARELTO) 20 MG TABS tablet Take 20 mg by mouth daily with supper.     No current facility-administered medications for this visit.     Allergies:   Eliquis [apixaban]    Social History:  The patient  reports that he quit smoking about 36 years ago. He has a 32.00 pack-year smoking history. He has never used smokeless tobacco. He reports that he drinks alcohol. He reports that he does not use drugs.   Family History:  The patient's family history includes Arthritis in his other.    ROS:  Please see the history of present illness.   Physical Exam: Blood pressure  138/82, pulse 81, height 5\' 10"  (1.778 m), weight 254 lb 9.6 oz (115.5 kg), SpO2 96 %.  GEN:  Well nourished, well developed in no acute distress HEENT: Normal NECK: No JVD; No carotid bruits LYMPHATICS: No lymphadenopathy CARDIAC: irreg irreg.  Very solf systolic muurmr  RESPIRATORY:  Clear to auscultation without rales, wheezing or rhonchi  ABDOMEN: Soft, non-tender, non-distended MUSCULOSKELETAL:   1 + edema  SKIN: Warm and dry NEUROLOGIC:  Alert and oriented x 3   EKG:    January 05, 2018: Atrial fib with a heart rate of 81.  No ST or T wave changes. Inc. RBBB   Recent Labs: No results found for requested labs within last 8760 hours.    Lipid Panel No results found for: CHOL, TRIG, HDL, CHOLHDL, VLDL, LDLCALC, LDLDIRECT    Wt Readings from Last 3 Encounters:  01/05/18 254 lb 9.6 oz (115.5 kg)  12/03/16 255 lb 6.4 oz (115.8 kg)  10/24/15 243 lb 6.4 oz (110.4 kg)      Other studies Reviewed: Additional studies/ records that were reviewed today include: . Review of the above records demonstrates:    ASSESSMENT AND PLAN:  1. Atrial fib -  Has chronic Afib.   2. Hypertension-    BP is well controlled   3. Hyperlipidemia -    Managed by his primary   4. Left hip replacement -    Current medicines are reviewed at length with the patient today.  The patient does not have concerns regarding medicines.  The following changes have been made:     Labs/ tests ordered today include:  No orders of the defined types were placed in this encounter.   Disposition:   FU with me in  1 year   Signed, Kristeen Miss, MD  01/05/2018 4:32 PM    [facility] [address] [place], [place], [place]  [address] Phone: [phone]; Fax: [phone]

## 2018-01-08 ENCOUNTER — Ambulatory Visit (HOSPITAL_COMMUNITY): Payer: Medicare HMO | Attending: Cardiology

## 2018-01-08 ENCOUNTER — Other Ambulatory Visit: Payer: Self-pay

## 2018-01-08 DIAGNOSIS — I4891 Unspecified atrial fibrillation: Secondary | ICD-10-CM | POA: Insufficient documentation

## 2018-01-08 DIAGNOSIS — G4733 Obstructive sleep apnea (adult) (pediatric): Secondary | ICD-10-CM | POA: Insufficient documentation

## 2018-01-08 DIAGNOSIS — E669 Obesity, unspecified: Secondary | ICD-10-CM | POA: Insufficient documentation

## 2018-01-08 DIAGNOSIS — Z6836 Body mass index (BMI) 36.0-36.9, adult: Secondary | ICD-10-CM | POA: Insufficient documentation

## 2018-01-08 DIAGNOSIS — R0609 Other forms of dyspnea: Secondary | ICD-10-CM

## 2018-01-08 DIAGNOSIS — I082 Rheumatic disorders of both aortic and tricuspid valves: Secondary | ICD-10-CM | POA: Insufficient documentation

## 2018-01-08 DIAGNOSIS — I351 Nonrheumatic aortic (valve) insufficiency: Secondary | ICD-10-CM | POA: Diagnosis not present

## 2018-01-08 DIAGNOSIS — I10 Essential (primary) hypertension: Secondary | ICD-10-CM | POA: Diagnosis not present

## 2018-01-08 DIAGNOSIS — I272 Pulmonary hypertension, unspecified: Secondary | ICD-10-CM | POA: Diagnosis not present

## 2018-01-11 ENCOUNTER — Telehealth: Payer: Self-pay | Admitting: Cardiovascular Disease

## 2018-01-11 DIAGNOSIS — R0602 Shortness of breath: Secondary | ICD-10-CM

## 2018-01-11 NOTE — Telephone Encounter (Signed)
Follow Up:    Returning Michelle's call from Friday, concerning his lab results.

## 2018-01-11 NOTE — Telephone Encounter (Signed)
Pt aware of echo results and the need to have PFT's . Order entered .Damon Weaver/cy

## 2018-01-15 DIAGNOSIS — H9190 Unspecified hearing loss, unspecified ear: Secondary | ICD-10-CM | POA: Diagnosis not present

## 2018-01-15 DIAGNOSIS — I10 Essential (primary) hypertension: Secondary | ICD-10-CM | POA: Diagnosis not present

## 2018-01-15 DIAGNOSIS — G4733 Obstructive sleep apnea (adult) (pediatric): Secondary | ICD-10-CM | POA: Diagnosis not present

## 2018-01-15 DIAGNOSIS — E7800 Pure hypercholesterolemia, unspecified: Secondary | ICD-10-CM | POA: Diagnosis not present

## 2018-01-15 DIAGNOSIS — Z86010 Personal history of colonic polyps: Secondary | ICD-10-CM | POA: Diagnosis not present

## 2018-01-15 DIAGNOSIS — E871 Hypo-osmolality and hyponatremia: Secondary | ICD-10-CM | POA: Diagnosis not present

## 2018-01-15 DIAGNOSIS — Z792 Long term (current) use of antibiotics: Secondary | ICD-10-CM | POA: Diagnosis not present

## 2018-01-15 DIAGNOSIS — I4891 Unspecified atrial fibrillation: Secondary | ICD-10-CM | POA: Diagnosis not present

## 2018-01-18 ENCOUNTER — Other Ambulatory Visit: Payer: Self-pay | Admitting: Cardiovascular Disease

## 2018-02-02 ENCOUNTER — Ambulatory Visit (INDEPENDENT_AMBULATORY_CARE_PROVIDER_SITE_OTHER): Payer: Medicare HMO | Admitting: Internal Medicine

## 2018-02-02 DIAGNOSIS — R0602 Shortness of breath: Secondary | ICD-10-CM | POA: Diagnosis not present

## 2018-02-02 LAB — PULMONARY FUNCTION TEST
DL/VA % PRED: 85 %
DL/VA: 3.81 ml/min/mmHg/L
DLCO UNC: 21.51 ml/min/mmHg
DLCO unc % pred: 74 %
FEF 25-75 POST: 2.78 L/s
FEF 25-75 PRE: 1.86 L/s
FEF2575-%Change-Post: 49 %
FEF2575-%Pred-Post: 124 %
FEF2575-%Pred-Pre: 83 %
FEV1-%Change-Post: 10 %
FEV1-%PRED-PRE: 86 %
FEV1-%Pred-Post: 95 %
FEV1-Post: 2.8 L
FEV1-Pre: 2.54 L
FEV1FVC-%Change-Post: 5 %
FEV1FVC-%PRED-PRE: 99 %
FEV6-%CHANGE-POST: 5 %
FEV6-%Pred-Post: 95 %
FEV6-%Pred-Pre: 90 %
FEV6-POST: 3.58 L
FEV6-Pre: 3.4 L
FEV6FVC-%CHANGE-POST: 0 %
FEV6FVC-%PRED-POST: 106 %
FEV6FVC-%Pred-Pre: 105 %
FVC-%Change-Post: 4 %
FVC-%Pred-Post: 90 %
FVC-%Pred-Pre: 86 %
FVC-PRE: 3.43 L
FVC-Post: 3.59 L
Post FEV1/FVC ratio: 78 %
Post FEV6/FVC ratio: 100 %
Pre FEV1/FVC ratio: 74 %
Pre FEV6/FVC Ratio: 99 %
RV % pred: 123 %
RV: 2.85 L
TLC % PRED: 96 %
TLC: 6.24 L

## 2018-02-02 NOTE — Progress Notes (Signed)
PFT completed today. 02/02/18  

## 2018-03-26 DIAGNOSIS — H906 Mixed conductive and sensorineural hearing loss, bilateral: Secondary | ICD-10-CM | POA: Diagnosis not present

## 2018-03-26 DIAGNOSIS — H6123 Impacted cerumen, bilateral: Secondary | ICD-10-CM | POA: Diagnosis not present

## 2018-04-09 DIAGNOSIS — Z23 Encounter for immunization: Secondary | ICD-10-CM | POA: Diagnosis not present

## 2018-08-26 DIAGNOSIS — H608X2 Other otitis externa, left ear: Secondary | ICD-10-CM | POA: Diagnosis not present

## 2018-08-26 DIAGNOSIS — Z974 Presence of external hearing-aid: Secondary | ICD-10-CM | POA: Diagnosis not present

## 2018-08-26 DIAGNOSIS — H906 Mixed conductive and sensorineural hearing loss, bilateral: Secondary | ICD-10-CM | POA: Diagnosis not present

## 2018-08-26 DIAGNOSIS — H6121 Impacted cerumen, right ear: Secondary | ICD-10-CM | POA: Diagnosis not present

## 2018-08-26 DIAGNOSIS — Z87891 Personal history of nicotine dependence: Secondary | ICD-10-CM | POA: Diagnosis not present

## 2018-09-02 DIAGNOSIS — H6121 Impacted cerumen, right ear: Secondary | ICD-10-CM | POA: Diagnosis not present

## 2018-11-05 DIAGNOSIS — Z1389 Encounter for screening for other disorder: Secondary | ICD-10-CM | POA: Diagnosis not present

## 2018-11-05 DIAGNOSIS — H9190 Unspecified hearing loss, unspecified ear: Secondary | ICD-10-CM | POA: Diagnosis not present

## 2018-11-05 DIAGNOSIS — E871 Hypo-osmolality and hyponatremia: Secondary | ICD-10-CM | POA: Diagnosis not present

## 2018-11-05 DIAGNOSIS — I4891 Unspecified atrial fibrillation: Secondary | ICD-10-CM | POA: Diagnosis not present

## 2018-11-05 DIAGNOSIS — Z0000 Encounter for general adult medical examination without abnormal findings: Secondary | ICD-10-CM | POA: Diagnosis not present

## 2018-11-05 DIAGNOSIS — I10 Essential (primary) hypertension: Secondary | ICD-10-CM | POA: Diagnosis not present

## 2018-11-05 DIAGNOSIS — E7800 Pure hypercholesterolemia, unspecified: Secondary | ICD-10-CM | POA: Diagnosis not present

## 2018-11-05 DIAGNOSIS — Z792 Long term (current) use of antibiotics: Secondary | ICD-10-CM | POA: Diagnosis not present

## 2018-11-05 DIAGNOSIS — Z86010 Personal history of colonic polyps: Secondary | ICD-10-CM | POA: Diagnosis not present

## 2018-11-10 DIAGNOSIS — E7800 Pure hypercholesterolemia, unspecified: Secondary | ICD-10-CM | POA: Diagnosis not present

## 2018-11-10 DIAGNOSIS — I10 Essential (primary) hypertension: Secondary | ICD-10-CM | POA: Diagnosis not present

## 2018-11-10 DIAGNOSIS — Z125 Encounter for screening for malignant neoplasm of prostate: Secondary | ICD-10-CM | POA: Diagnosis not present

## 2018-11-29 DIAGNOSIS — H6121 Impacted cerumen, right ear: Secondary | ICD-10-CM | POA: Diagnosis not present

## 2019-01-25 ENCOUNTER — Ambulatory Visit: Payer: Medicare HMO | Admitting: Cardiovascular Disease

## 2019-01-31 DIAGNOSIS — H6123 Impacted cerumen, bilateral: Secondary | ICD-10-CM | POA: Diagnosis not present

## 2019-02-02 DIAGNOSIS — H6123 Impacted cerumen, bilateral: Secondary | ICD-10-CM | POA: Diagnosis not present

## 2019-02-12 NOTE — Progress Notes (Signed)
Cardiology Office Note   Date:  02/15/2019   ID:  Damon, Weaver [birth year], MRN [id number]  PCP:  Gaynelle Arabian, MD  Cardiologist:   Mertie Moores, MD   Problem List  1. Atrial fib- CHADS2VASC score of 2 ( age,  HTN)  2. Hypertension 3. Hyperlipidemia 4. Aortic Insufficiency  - Moderate  AI .  5. Mild MR:    Chief Complaint  Patient presents with  . Atrial Fibrillation       Damon Weaver is a 73 y.o. male who presents for follow up of his atrial fib We increased his coreg during his last visit.  He had a drug rash previously that has resolved when we switch from Eliquis to Xarelto.   He has had a left hip replacement since i last saw him Still has lots of pain. Has had lots of diffuse swelling in his left leg  He was told that this is normal following hip replacement.  No CP , no dyspnea.  No dizziness.   He was late taking a Xarelto dose several days ago  August 22, 2014:  Damon Weaver is doing ok.  Tolerating the afib well. Ready for cardioversion.  Has been taking the Xarelto. Has had persistent swelling in his left leg following hip replacement.  Venous dopplers were negative for DVT  October 10, 2014:  Damon Weaver is seen in follow up today . Had cardioversion last month.  Still feels well.    Nov. 4, 2016:  Damon Weaver is seen today for follow up of his atrial fib.  Has been cardioverted in the past.  Went back into atrial fib after a couple of days  Still fairly asymptomatic. Notices a bit more fatigue but otherwise is doing ok Trying to walk more .  Has some shortness of breath walking up a long hill.   Oct 24, 2015: Doing well Mild DOE with walking  Is working out more   January 05, 2018  Has mild DOE walking up a hill No DOE with normal walking  Not getting as much exercise as he would like   Aug. 24, 2020 Damon Weaver is seen today for follow-up of his atrial fibrillation.  His CHADS2VASC  score is 2 ( age, HTN) .  He has a history of hypertension, hyperlipidemia,  moderate aortic insufficiency.  I saw him for virtual visit on July 19.  He was having increasing shortness of breath. Dyspnea is about the same   Has had a sleep study .    Was not fitted with a CPAP     Advised weight loss .    Past Medical History:  Diagnosis Date  . Arthritis   . Dysrhythmia    Afib  . Hypertension   . Numbness and tingling    Hx: of left leg  . OSA (obstructive sleep apnea) 07/25/2015    Mild OSA overall with AHI 8.2/hr and moderate during REM sleep with AHI 21/hr  . Seasonal allergies     Past Surgical History:  Procedure Laterality Date  . CARDIOVERSION N/A 08/30/2014   Procedure: CARDIOVERSION;  Surgeon: Thayer Headings, MD;  Location: [facility];  Service: Cardiovascular;  Laterality: N/A;  . COLONOSCOPY W/ BIOPSIES AND POLYPECTOMY     Hx; of  . LUMBAR LAMINECTOMY/DECOMPRESSION MICRODISCECTOMY N/A 03/24/2013   Procedure: LUMBAR LAMINECTOMY/DECOMPRESSION MICRODISCECTOMY LUMBAR THREE-FOUR,FOUR-FIVE;  Surgeon: Eustace Moore, MD;  Location: [facility];  Service: Neurosurgery;  Laterality: N/A;  . ROTATOR CUFF REPAIR  Hx of right arm  . TOTAL HIP ARTHROPLASTY Left 06/27/2014   DR MURPHY  . TOTAL HIP ARTHROPLASTY Left 06/27/2014   Procedure: LEFT TOTAL HIP ARTHROPLASTY ANTERIOR APPROACH;  Surgeon: [name], MD;  Location: [facility];  Service: Orthopedics;  Laterality: Left;     Current Outpatient Medications  Medication Sig Dispense Refill  . lisinopril (PRINIVIL,ZESTRIL) 20 MG tablet Take 1 tablet (20 mg total) by mouth daily. 90 tablet 3  . metoprolol tartrate (LOPRESSOR) 25 MG tablet TAKE 1 TABLET TWICE DAILY 180 tablet 3  . pravastatin (PRAVACHOL) 40 MG tablet Take 40 mg by mouth at bedtime.     . rivaroxaban (XARELTO) 20 MG TABS tablet Take 1 tablet (20 mg total) by mouth daily with supper. 90 tablet 3   No current facility-administered medications for this visit.     Allergies:   Eliquis [apixaban]    Social History:  The patient   reports that he quit smoking about 37 years ago. He has a 32.00 pack-year smoking history. He has never used smokeless tobacco. He reports current alcohol use. He reports that he does not use drugs.   Family History:  The patient's family history includes Arthritis in an other family member.    ROS:  Please see the history of present illness.   Physical Exam: Blood pressure 122/88, pulse 79, height 5\' 10"  (1.778 m), weight 254 lb 12.8 oz (115.6 kg), SpO2 97 %.  GEN:  Well nourished, well developed in no acute distress HEENT: Normal NECK: No JVD; No carotid bruits LYMPHATICS: No lymphadenopathy CARDIAC: Irreg. Irreg.   Soft systolic and diastolic function  RESPIRATORY:  Clear to auscultation without rales, wheezing or rhonchi  ABDOMEN: Soft, non-tender, non-distended MUSCULOSKELETAL:  No edema; No deformity  SKIN: Warm and dry NEUROLOGIC:  Alert and oriented x 3   EKG:     Aug. 25, 2020 :  Atrial fib with HR of 79    Recent Labs: No results found for requested labs within last 8760 hours.    Lipid Panel No results found for: CHOL, TRIG, HDL, CHOLHDL, VLDL, LDLCALC, LDLDIRECT    Wt Readings from Last 3 Encounters:  02/15/19 254 lb 12.8 oz (115.6 kg)  01/05/18 254 lb 9.6 oz (115.5 kg)  12/03/16 255 lb 6.4 oz (115.8 kg)      Other studies Reviewed: Additional studies/ records that were reviewed today include: . Review of the above records demonstrates:    ASSESSMENT AND PLAN:  1. Atrial fib -   Chronic ,    2. Hypertension-    BP is well controlled.   3. Hyperlipidemia -    Stable   4. Pulmonary Hypertension  -  Does not have OSA.   Advised weight loss   5.  Aortic stenosis:   Has mild- mod  AS and mod AI ,  We discussed the possible need for AVR in the future but it is not indicated yet.     Current medicines are reviewed at length with the patient today.  The patient does not have concerns regarding medicines.  The following changes have been made:      Labs/ tests ordered today include:  No orders of the defined types were placed in this encounter.   Disposition:   FU with me in  1 year   Signed, Kristeen MissPhilip Copelyn Widmer, MD  02/15/2019 2:01 PM    [facility] [address], [place], [place]  [address] Phone: [phone]; Fax: [phone]  

## 2019-02-15 ENCOUNTER — Other Ambulatory Visit: Payer: Self-pay

## 2019-02-15 ENCOUNTER — Ambulatory Visit (INDEPENDENT_AMBULATORY_CARE_PROVIDER_SITE_OTHER): Payer: Medicare HMO | Admitting: Cardiovascular Disease

## 2019-02-15 ENCOUNTER — Encounter: Payer: Self-pay | Admitting: Cardiovascular Disease

## 2019-02-15 VITALS — BP 122/88 | HR 79 | Ht 70.0 in | Wt 254.8 lb

## 2019-02-15 DIAGNOSIS — I351 Nonrheumatic aortic (valve) insufficiency: Secondary | ICD-10-CM | POA: Diagnosis not present

## 2019-02-15 DIAGNOSIS — I482 Chronic atrial fibrillation, unspecified: Secondary | ICD-10-CM

## 2019-02-15 DIAGNOSIS — I272 Pulmonary hypertension, unspecified: Secondary | ICD-10-CM | POA: Diagnosis not present

## 2019-02-15 NOTE — Patient Instructions (Signed)

## 2019-03-14 DIAGNOSIS — H6123 Impacted cerumen, bilateral: Secondary | ICD-10-CM | POA: Diagnosis not present

## 2019-03-26 DIAGNOSIS — Z23 Encounter for immunization: Secondary | ICD-10-CM | POA: Diagnosis not present

## 2019-05-02 DIAGNOSIS — H6123 Impacted cerumen, bilateral: Secondary | ICD-10-CM | POA: Diagnosis not present

## 2019-06-30 DIAGNOSIS — H6123 Impacted cerumen, bilateral: Secondary | ICD-10-CM | POA: Diagnosis not present

## 2019-07-19 DIAGNOSIS — H906 Mixed conductive and sensorineural hearing loss, bilateral: Secondary | ICD-10-CM | POA: Diagnosis not present

## 2019-08-31 DIAGNOSIS — H6123 Impacted cerumen, bilateral: Secondary | ICD-10-CM | POA: Diagnosis not present

## 2019-10-04 DIAGNOSIS — I10 Essential (primary) hypertension: Secondary | ICD-10-CM | POA: Diagnosis not present

## 2019-10-04 DIAGNOSIS — I4891 Unspecified atrial fibrillation: Secondary | ICD-10-CM | POA: Diagnosis not present

## 2019-10-04 DIAGNOSIS — E7800 Pure hypercholesterolemia, unspecified: Secondary | ICD-10-CM | POA: Diagnosis not present

## 2019-10-07 DIAGNOSIS — Z125 Encounter for screening for malignant neoplasm of prostate: Secondary | ICD-10-CM | POA: Diagnosis not present

## 2019-10-07 DIAGNOSIS — I10 Essential (primary) hypertension: Secondary | ICD-10-CM | POA: Diagnosis not present

## 2019-10-07 DIAGNOSIS — Z0000 Encounter for general adult medical examination without abnormal findings: Secondary | ICD-10-CM | POA: Diagnosis not present

## 2019-10-07 DIAGNOSIS — Z1389 Encounter for screening for other disorder: Secondary | ICD-10-CM | POA: Diagnosis not present

## 2019-10-07 DIAGNOSIS — Z792 Long term (current) use of antibiotics: Secondary | ICD-10-CM | POA: Diagnosis not present

## 2019-10-07 DIAGNOSIS — E7800 Pure hypercholesterolemia, unspecified: Secondary | ICD-10-CM | POA: Diagnosis not present

## 2019-10-07 DIAGNOSIS — E871 Hypo-osmolality and hyponatremia: Secondary | ICD-10-CM | POA: Diagnosis not present

## 2019-10-07 DIAGNOSIS — H9190 Unspecified hearing loss, unspecified ear: Secondary | ICD-10-CM | POA: Diagnosis not present

## 2019-10-07 DIAGNOSIS — Z86010 Personal history of colonic polyps: Secondary | ICD-10-CM | POA: Diagnosis not present

## 2019-10-07 DIAGNOSIS — I4891 Unspecified atrial fibrillation: Secondary | ICD-10-CM | POA: Diagnosis not present

## 2019-10-07 DIAGNOSIS — G4733 Obstructive sleep apnea (adult) (pediatric): Secondary | ICD-10-CM | POA: Diagnosis not present

## 2019-11-03 DIAGNOSIS — H6123 Impacted cerumen, bilateral: Secondary | ICD-10-CM | POA: Diagnosis not present

## 2019-12-21 DIAGNOSIS — I10 Essential (primary) hypertension: Secondary | ICD-10-CM | POA: Diagnosis not present

## 2019-12-21 DIAGNOSIS — E7800 Pure hypercholesterolemia, unspecified: Secondary | ICD-10-CM | POA: Diagnosis not present

## 2019-12-21 DIAGNOSIS — I4891 Unspecified atrial fibrillation: Secondary | ICD-10-CM | POA: Diagnosis not present

## 2020-01-03 DIAGNOSIS — H6123 Impacted cerumen, bilateral: Secondary | ICD-10-CM | POA: Diagnosis not present

## 2020-02-13 ENCOUNTER — Encounter: Payer: Self-pay | Admitting: Cardiovascular Disease

## 2020-02-13 NOTE — Progress Notes (Signed)
Cardiology Office Note   Date:  02/14/2020   ID:  Damon Weaver, Damon Weaver 08/17/1945, MRN [id number]  PCP:  Blair Heys, MD  Cardiologist:   Kristeen Miss, MD   Problem List  1. Atrial fib- CHADS2VASC score of 2 ( age,  HTN)  2. Hypertension 3. Hyperlipidemia 4. Aortic Insufficiency  - Moderate  AI .  5. Mild MR:    Chief Complaint  Patient presents with  . Atrial Fibrillation       Damon Weaver is a 74 y.o. male who presents for follow up of his atrial fib We increased his coreg during his last visit.  He had a drug rash previously that has resolved when we switch from Eliquis to Xarelto.   He has had a left hip replacement since i last saw him Still has lots of pain. Has had lots of diffuse swelling in his left leg  He was told that this is normal following hip replacement.  No CP , no dyspnea.  No dizziness.   He was late taking a Xarelto dose several days ago  August 22, 2014:  Damon Weaver is doing ok.  Tolerating the afib well. Ready for cardioversion.  Has been taking the Xarelto. Has had persistent swelling in his left leg following hip replacement.  Venous dopplers were negative for DVT  October 10, 2014:  Damon Weaver is seen in follow up today . Had cardioversion last month.  Still feels well.    Nov. 4, 2016:  Damon Weaver is seen today for follow up of his atrial fib.  Has been cardioverted in the past.  Went back into atrial fib after a couple of days  Still fairly asymptomatic. Notices a bit more fatigue but otherwise is doing ok Trying to walk more .  Has some shortness of breath walking up a long hill.   Oct 24, 2015: Doing well Mild DOE with walking  Is working out more   January 05, 2018  Has mild DOE walking up a hill No DOE with normal walking  Not getting as much exercise as he would like   Aug. 24, 2020 Damon Weaver is seen today for follow-up of his atrial fibrillation.  His CHADS2VASC  score is 2 ( age, HTN) .  He has a history of hypertension, hyperlipidemia,  moderate aortic insufficiency.  I saw him for virtual visit on July 19.  He was having increasing shortness of breath. Dyspnea is about the same   Has had a sleep study .    Was not fitted with a CPAP     Advised weight loss .   Aug. 24, 2021: Damon Weaver is seen for follow up of his Afib.   He has a hx of AI, HLD, HTN Still in Afib BP is a bit elevated.  Does not avoid salt .  Is fairly active ,  Walks his dog regularly ( new  golden doodle)  Still working ,  Has an Nurse, learning disability business.    Past Medical History:  Diagnosis Date  . Arthritis   . Dysrhythmia    Afib  . Hypertension   . Numbness and tingling    Hx: of left leg  . OSA (obstructive sleep apnea) 07/25/2015    Mild OSA overall with AHI 8.2/hr and moderate during REM sleep with AHI 21/hr  . Seasonal allergies     Past Surgical History:  Procedure Laterality Date  . CARDIOVERSION N/A 08/30/2014   Procedure: CARDIOVERSION;  Surgeon: Vesta Mixer, MD;  Location: [facility];  Service: Cardiovascular;  Laterality: N/A;  . COLONOSCOPY W/ BIOPSIES AND POLYPECTOMY     Hx; of  . LUMBAR LAMINECTOMY/DECOMPRESSION MICRODISCECTOMY N/A 03/24/2013   Procedure: LUMBAR LAMINECTOMY/DECOMPRESSION MICRODISCECTOMY LUMBAR THREE-FOUR,FOUR-FIVE;  Surgeon: Tia Alert, MD;  Location: [facility];  Service: Neurosurgery;  Laterality: N/A;  . ROTATOR CUFF REPAIR     Hx of right arm  . TOTAL HIP ARTHROPLASTY Left 06/27/2014   DR MURPHY  . TOTAL HIP ARTHROPLASTY Left 06/27/2014   Procedure: LEFT TOTAL HIP ARTHROPLASTY ANTERIOR APPROACH;  Surgeon: Sheral Apley, MD;  Location: [facility];  Service: Orthopedics;  Laterality: Left;     Current Outpatient Medications  Medication Sig Dispense Refill  . lisinopril (PRINIVIL,ZESTRIL) 20 MG tablet Take 1 tablet (20 mg total) by mouth daily. 90 tablet 3  . metoprolol tartrate (LOPRESSOR) 25 MG tablet TAKE 1 TABLET TWICE DAILY 180 tablet 3  . pravastatin (PRAVACHOL) 40 MG tablet Take 40 mg by mouth at bedtime.      . rivaroxaban (XARELTO) 20 MG TABS tablet Take 1 tablet (20 mg total) by mouth daily with supper. 90 tablet 3  . hydrochlorothiazide (HYDRODIURIL) 25 MG tablet Take 1 tablet (25 mg total) by mouth daily. 90 tablet 3  . potassium chloride SA (KLOR-CON) 20 MEQ tablet Take 1 tablet (20 mEq total) by mouth daily. 90 tablet 3   No current facility-administered medications for this visit.    Allergies:   Eliquis [apixaban]    Social History:  The patient  reports that he quit smoking about 38 years ago. He has a 32.00 pack-year smoking history. He has never used smokeless tobacco. He reports current alcohol use. He reports that he does not use drugs.   Family History:  The patient's family history includes Arthritis in an other family member.    ROS:  Please see the history of present illness.   Physical Exam: Blood pressure (!) 146/88, pulse 80, height 5\' 10"  (1.778 m), weight 254 lb (115.2 kg), SpO2 95 %.  GEN:  Well nourished, well developed in no acute distress, mildly obese  HEENT: Normal NECK: No JVD; No carotid bruits LYMPHATICS: No lymphadenopathy CARDIAC: Irreg. Irreg. , soft systolic murmur  RESPIRATORY:  Clear to auscultation without rales, wheezing or rhonchi  ABDOMEN: Soft, non-tender, non-distended MUSCULOSKELETAL:  No edema; No deformity  SKIN: Warm and dry NEUROLOGIC:  Alert and oriented x 3    EKG:     February 14, 2020: Atrial fibrillation with a heart rate of 80.  No ST or T wave changes.   Recent Labs: No results found for requested labs within last 8760 hours.    Lipid Panel No results found for: CHOL, TRIG, HDL, CHOLHDL, VLDL, LDLCALC, LDLDIRECT    Wt Readings from Last 3 Encounters:  02/14/20 254 lb (115.2 kg)  02/15/19 254 lb 12.8 oz (115.6 kg)  01/05/18 254 lb 9.6 oz (115.5 kg)      Other studies Reviewed: Additional studies/ records that were reviewed today include: . Review of the above records demonstrates:    ASSESSMENT AND PLAN:  1.  Atrial fib -     Stable .   HR is well controlled.   2. Hypertension-     Continues to be mildly elevated.   Will start HCTZ 25 mg a day ,  Kdur 20 meq a day .  BMP in 3 weeks.   Follow up in 1 year.     3. Hyperlipidemia -  Managed by Dr. Manus Gunning.    4. Pulmonary Hypertension  -   5.  Aortic stenosis:   Mild    Current medicines are reviewed at length with the patient today.  The patient does not have concerns regarding medicines.  The following changes have been made:     Labs/ tests ordered today include:   Orders Placed This Encounter  Procedures  . Basic Metabolic Panel (BMET)  . EKG 12-Lead    Disposition:   FU with me in  1 year   Signed, Kristeen Miss, MD  02/14/2020 2:47 PM    [facility] [address] [place], [place], [place]  [address] Phone: [phone]; Fax: [phone]

## 2020-02-14 ENCOUNTER — Encounter: Payer: Self-pay | Admitting: Cardiovascular Disease

## 2020-02-14 ENCOUNTER — Ambulatory Visit: Payer: Medicare HMO | Admitting: Cardiovascular Disease

## 2020-02-14 ENCOUNTER — Other Ambulatory Visit: Payer: Self-pay

## 2020-02-14 VITALS — BP 146/88 | HR 80 | Ht 70.0 in | Wt 254.0 lb

## 2020-02-14 DIAGNOSIS — I272 Pulmonary hypertension, unspecified: Secondary | ICD-10-CM | POA: Diagnosis not present

## 2020-02-14 DIAGNOSIS — I10 Essential (primary) hypertension: Secondary | ICD-10-CM

## 2020-02-14 DIAGNOSIS — I351 Nonrheumatic aortic (valve) insufficiency: Secondary | ICD-10-CM

## 2020-02-14 DIAGNOSIS — I482 Chronic atrial fibrillation, unspecified: Secondary | ICD-10-CM

## 2020-02-14 DIAGNOSIS — I4819 Other persistent atrial fibrillation: Secondary | ICD-10-CM | POA: Diagnosis not present

## 2020-02-14 DIAGNOSIS — E7800 Pure hypercholesterolemia, unspecified: Secondary | ICD-10-CM | POA: Diagnosis not present

## 2020-02-14 DIAGNOSIS — E785 Hyperlipidemia, unspecified: Secondary | ICD-10-CM

## 2020-02-14 DIAGNOSIS — I4891 Unspecified atrial fibrillation: Secondary | ICD-10-CM | POA: Diagnosis not present

## 2020-02-14 MED ORDER — HYDROCHLOROTHIAZIDE 25 MG PO TABS
25.0000 mg | ORAL_TABLET | Freq: Every day | ORAL | 3 refills | Status: DC
Start: 2020-02-14 — End: 2020-10-10

## 2020-02-14 MED ORDER — POTASSIUM CHLORIDE CRYS ER 20 MEQ PO TBCR: 20.0000 meq | EXTENDED_RELEASE_TABLET | Freq: Every day | ORAL | 11 refills | Status: DC

## 2020-02-14 MED ORDER — POTASSIUM CHLORIDE CRYS ER 20 MEQ PO TBCR: 20.0000 meq | EXTENDED_RELEASE_TABLET | Freq: Every day | ORAL | 3 refills | Status: DC

## 2020-02-14 MED ORDER — HYDROCHLOROTHIAZIDE 25 MG PO TABS: 25.0000 mg | ORAL_TABLET | Freq: Every day | ORAL | 11 refills | Status: DC

## 2020-02-14 NOTE — Patient Instructions (Signed)
Medication Instructions:  Your physician has recommended you make the following change in your medication:  START HCTZ (Hydrochlorothiazide) 25 mg once daily in the morning START Kdur (Potassium chloride) 20 mEq once daily   *If you need a refill on your cardiac medications before your next appointment, please call your pharmacy*   Lab Work: Your physician recommends that you return for lab work in: 3 weeks on Tuesday Sept. 14. Even though you have an "appointment time" you may come in anytime between 7:30 am and 4:45 pm You do not have to fast  If you have labs (blood work) drawn today and your tests are completely normal, you will receive your results only by: Marland Kitchen MyChart Message (if you have MyChart) OR . A paper copy in the mail If you have any lab test that is abnormal or we need to change your treatment, we will call you to review the results.   Testing/Procedures: None Ordered   Follow-Up: At [facility], you and your health needs are our priority.  As part of our continuing mission to provide you with exceptional heart care, we have created designated Provider Care Teams.  These Care Teams include your primary Cardiologist (physician) and Advanced Practice Providers (APPs -  Physician Assistants and Nurse Practitioners) who all work together to provide you with the care you need, when you need it.   Your next appointment:   1 year(s)  The format for your next appointment:   In Person  Provider:   You may see Kristeen Miss, MD or one of the following Advanced Practice Providers on your designated Care Team:    Tereso Newcomer, PA-C  [place], [place]

## 2020-03-05 DIAGNOSIS — H6123 Impacted cerumen, bilateral: Secondary | ICD-10-CM | POA: Diagnosis not present

## 2020-03-06 ENCOUNTER — Other Ambulatory Visit: Payer: Medicare HMO

## 2020-03-06 ENCOUNTER — Other Ambulatory Visit: Payer: Self-pay

## 2020-03-06 DIAGNOSIS — I10 Essential (primary) hypertension: Secondary | ICD-10-CM

## 2020-03-06 DIAGNOSIS — I482 Chronic atrial fibrillation, unspecified: Secondary | ICD-10-CM | POA: Diagnosis not present

## 2020-03-06 DIAGNOSIS — I351 Nonrheumatic aortic (valve) insufficiency: Secondary | ICD-10-CM | POA: Diagnosis not present

## 2020-03-06 DIAGNOSIS — E785 Hyperlipidemia, unspecified: Secondary | ICD-10-CM

## 2020-03-07 LAB — BASIC METABOLIC PANEL
BUN/Creatinine Ratio: 22 (ref 10–24)
BUN: 19 mg/dL (ref 8–27)
CO2: 25 mmol/L (ref 20–29)
Calcium: 9.5 mg/dL (ref 8.6–10.2)
Chloride: 94 mmol/L — ABNORMAL LOW (ref 96–106)
Creatinine, Ser: 0.86 mg/dL (ref 0.76–1.27)
GFR calc Af Amer: 100 mL/min/{1.73_m2} (ref >59)
GFR calc non Af Amer: 87 mL/min/{1.73_m2} (ref >59)
Glucose: 128 mg/dL — ABNORMAL HIGH (ref 65–99)
Potassium: 4.4 mmol/L (ref 3.5–5.2)
Sodium: 134 mmol/L (ref 134–144)

## 2020-04-28 DIAGNOSIS — Z23 Encounter for immunization: Secondary | ICD-10-CM | POA: Diagnosis not present

## 2020-05-07 DIAGNOSIS — H938X3 Other specified disorders of ear, bilateral: Secondary | ICD-10-CM | POA: Diagnosis not present

## 2020-05-07 DIAGNOSIS — H906 Mixed conductive and sensorineural hearing loss, bilateral: Secondary | ICD-10-CM | POA: Diagnosis not present

## 2020-05-07 DIAGNOSIS — H61303 Acquired stenosis of external ear canal, unspecified, bilateral: Secondary | ICD-10-CM | POA: Diagnosis not present

## 2020-06-12 DIAGNOSIS — H61303 Acquired stenosis of external ear canal, unspecified, bilateral: Secondary | ICD-10-CM | POA: Diagnosis not present

## 2020-06-12 DIAGNOSIS — H6123 Impacted cerumen, bilateral: Secondary | ICD-10-CM | POA: Diagnosis not present

## 2020-06-12 DIAGNOSIS — H906 Mixed conductive and sensorineural hearing loss, bilateral: Secondary | ICD-10-CM | POA: Diagnosis not present

## 2020-06-12 DIAGNOSIS — Z974 Presence of external hearing-aid: Secondary | ICD-10-CM | POA: Diagnosis not present

## 2020-06-27 DIAGNOSIS — H906 Mixed conductive and sensorineural hearing loss, bilateral: Secondary | ICD-10-CM | POA: Diagnosis not present

## 2020-06-27 DIAGNOSIS — H61893 Other specified disorders of external ear, bilateral: Secondary | ICD-10-CM | POA: Diagnosis not present

## 2020-06-27 DIAGNOSIS — H61303 Acquired stenosis of external ear canal, unspecified, bilateral: Secondary | ICD-10-CM | POA: Diagnosis not present

## 2020-06-27 DIAGNOSIS — H9072 Mixed conductive and sensorineural hearing loss, unilateral, left ear, with unrestricted hearing on the contralateral side: Secondary | ICD-10-CM | POA: Diagnosis not present

## 2020-06-27 DIAGNOSIS — Z974 Presence of external hearing-aid: Secondary | ICD-10-CM | POA: Diagnosis not present

## 2020-07-16 DIAGNOSIS — H6123 Impacted cerumen, bilateral: Secondary | ICD-10-CM | POA: Diagnosis not present

## 2020-09-18 DIAGNOSIS — H61303 Acquired stenosis of external ear canal, unspecified, bilateral: Secondary | ICD-10-CM | POA: Diagnosis not present

## 2020-09-18 DIAGNOSIS — R040 Epistaxis: Secondary | ICD-10-CM | POA: Diagnosis not present

## 2020-09-18 DIAGNOSIS — H906 Mixed conductive and sensorineural hearing loss, bilateral: Secondary | ICD-10-CM | POA: Diagnosis not present

## 2020-09-19 DIAGNOSIS — R040 Epistaxis: Secondary | ICD-10-CM | POA: Diagnosis not present

## 2020-09-19 DIAGNOSIS — Z7901 Long term (current) use of anticoagulants: Secondary | ICD-10-CM | POA: Diagnosis not present

## 2020-10-10 ENCOUNTER — Other Ambulatory Visit: Payer: Self-pay | Admitting: Cardiovascular Disease

## 2020-10-10 DIAGNOSIS — Z7901 Long term (current) use of anticoagulants: Secondary | ICD-10-CM | POA: Diagnosis not present

## 2020-10-10 DIAGNOSIS — J3489 Other specified disorders of nose and nasal sinuses: Secondary | ICD-10-CM | POA: Diagnosis not present

## 2020-10-10 DIAGNOSIS — R040 Epistaxis: Secondary | ICD-10-CM | POA: Diagnosis not present

## 2020-10-17 DIAGNOSIS — D6869 Other thrombophilia: Secondary | ICD-10-CM | POA: Diagnosis not present

## 2020-10-17 DIAGNOSIS — E7800 Pure hypercholesterolemia, unspecified: Secondary | ICD-10-CM | POA: Diagnosis not present

## 2020-10-17 DIAGNOSIS — I10 Essential (primary) hypertension: Secondary | ICD-10-CM | POA: Diagnosis not present

## 2020-10-17 DIAGNOSIS — E871 Hypo-osmolality and hyponatremia: Secondary | ICD-10-CM | POA: Diagnosis not present

## 2020-10-17 DIAGNOSIS — Z792 Long term (current) use of antibiotics: Secondary | ICD-10-CM | POA: Diagnosis not present

## 2020-10-17 DIAGNOSIS — Z0000 Encounter for general adult medical examination without abnormal findings: Secondary | ICD-10-CM | POA: Diagnosis not present

## 2020-10-17 DIAGNOSIS — H9190 Unspecified hearing loss, unspecified ear: Secondary | ICD-10-CM | POA: Diagnosis not present

## 2020-10-17 DIAGNOSIS — Z1389 Encounter for screening for other disorder: Secondary | ICD-10-CM | POA: Diagnosis not present

## 2020-10-17 DIAGNOSIS — G4733 Obstructive sleep apnea (adult) (pediatric): Secondary | ICD-10-CM | POA: Diagnosis not present

## 2020-10-17 DIAGNOSIS — Z86010 Personal history of colonic polyps: Secondary | ICD-10-CM | POA: Diagnosis not present

## 2020-10-17 DIAGNOSIS — I4891 Unspecified atrial fibrillation: Secondary | ICD-10-CM | POA: Diagnosis not present

## 2020-11-20 DIAGNOSIS — H938X3 Other specified disorders of ear, bilateral: Secondary | ICD-10-CM | POA: Diagnosis not present

## 2021-01-10 DIAGNOSIS — H61303 Acquired stenosis of external ear canal, unspecified, bilateral: Secondary | ICD-10-CM | POA: Diagnosis not present

## 2021-01-10 DIAGNOSIS — H938X3 Other specified disorders of ear, bilateral: Secondary | ICD-10-CM | POA: Diagnosis not present

## 2021-01-25 DIAGNOSIS — I10 Essential (primary) hypertension: Secondary | ICD-10-CM | POA: Diagnosis not present

## 2021-01-25 DIAGNOSIS — H61303 Acquired stenosis of external ear canal, unspecified, bilateral: Secondary | ICD-10-CM | POA: Diagnosis not present

## 2021-01-25 DIAGNOSIS — Z01812 Encounter for preprocedural laboratory examination: Secondary | ICD-10-CM | POA: Diagnosis not present

## 2021-01-30 DIAGNOSIS — E871 Hypo-osmolality and hyponatremia: Secondary | ICD-10-CM | POA: Diagnosis not present

## 2021-02-01 DIAGNOSIS — H61392 Other acquired stenosis of left external ear canal: Secondary | ICD-10-CM | POA: Diagnosis not present

## 2021-02-01 DIAGNOSIS — H903 Sensorineural hearing loss, bilateral: Secondary | ICD-10-CM | POA: Diagnosis not present

## 2021-02-01 DIAGNOSIS — H6092 Unspecified otitis externa, left ear: Secondary | ICD-10-CM | POA: Diagnosis not present

## 2021-02-01 DIAGNOSIS — H61303 Acquired stenosis of external ear canal, unspecified, bilateral: Secondary | ICD-10-CM | POA: Diagnosis not present

## 2021-02-01 DIAGNOSIS — L905 Scar conditions and fibrosis of skin: Secondary | ICD-10-CM | POA: Diagnosis not present

## 2021-02-01 DIAGNOSIS — H906 Mixed conductive and sensorineural hearing loss, bilateral: Secondary | ICD-10-CM | POA: Diagnosis not present

## 2021-02-01 DIAGNOSIS — H61322 Acquired stenosis of left external ear canal secondary to inflammation and infection: Secondary | ICD-10-CM | POA: Diagnosis not present

## 2021-02-01 DIAGNOSIS — H61302 Acquired stenosis of left external ear canal, unspecified: Secondary | ICD-10-CM | POA: Diagnosis not present

## 2021-02-13 DIAGNOSIS — Z974 Presence of external hearing-aid: Secondary | ICD-10-CM | POA: Diagnosis not present

## 2021-02-13 DIAGNOSIS — H906 Mixed conductive and sensorineural hearing loss, bilateral: Secondary | ICD-10-CM | POA: Diagnosis not present

## 2021-02-13 DIAGNOSIS — H6121 Impacted cerumen, right ear: Secondary | ICD-10-CM | POA: Diagnosis not present

## 2021-02-13 DIAGNOSIS — Z9889 Other specified postprocedural states: Secondary | ICD-10-CM | POA: Diagnosis not present

## 2021-02-13 DIAGNOSIS — H61303 Acquired stenosis of external ear canal, unspecified, bilateral: Secondary | ICD-10-CM | POA: Diagnosis not present

## 2021-03-06 DIAGNOSIS — Z9889 Other specified postprocedural states: Secondary | ICD-10-CM | POA: Diagnosis not present

## 2021-03-06 DIAGNOSIS — H906 Mixed conductive and sensorineural hearing loss, bilateral: Secondary | ICD-10-CM | POA: Diagnosis not present

## 2021-03-06 DIAGNOSIS — Z974 Presence of external hearing-aid: Secondary | ICD-10-CM | POA: Diagnosis not present

## 2021-03-06 DIAGNOSIS — H61303 Acquired stenosis of external ear canal, unspecified, bilateral: Secondary | ICD-10-CM | POA: Diagnosis not present

## 2021-04-02 DIAGNOSIS — H938X3 Other specified disorders of ear, bilateral: Secondary | ICD-10-CM | POA: Diagnosis not present

## 2021-04-02 DIAGNOSIS — Z9889 Other specified postprocedural states: Secondary | ICD-10-CM | POA: Diagnosis not present

## 2021-04-02 DIAGNOSIS — H6121 Impacted cerumen, right ear: Secondary | ICD-10-CM | POA: Diagnosis not present

## 2021-04-02 DIAGNOSIS — H906 Mixed conductive and sensorineural hearing loss, bilateral: Secondary | ICD-10-CM | POA: Diagnosis not present

## 2021-04-02 DIAGNOSIS — Z974 Presence of external hearing-aid: Secondary | ICD-10-CM | POA: Diagnosis not present

## 2021-04-02 DIAGNOSIS — H61303 Acquired stenosis of external ear canal, unspecified, bilateral: Secondary | ICD-10-CM | POA: Diagnosis not present

## 2021-04-16 DIAGNOSIS — H906 Mixed conductive and sensorineural hearing loss, bilateral: Secondary | ICD-10-CM | POA: Diagnosis not present

## 2021-04-16 DIAGNOSIS — H61303 Acquired stenosis of external ear canal, unspecified, bilateral: Secondary | ICD-10-CM | POA: Diagnosis not present

## 2021-04-16 DIAGNOSIS — Z9889 Other specified postprocedural states: Secondary | ICD-10-CM | POA: Diagnosis not present

## 2021-04-16 DIAGNOSIS — Z974 Presence of external hearing-aid: Secondary | ICD-10-CM | POA: Diagnosis not present

## 2021-04-30 DIAGNOSIS — Z9889 Other specified postprocedural states: Secondary | ICD-10-CM | POA: Diagnosis not present

## 2021-04-30 DIAGNOSIS — H906 Mixed conductive and sensorineural hearing loss, bilateral: Secondary | ICD-10-CM | POA: Diagnosis not present

## 2021-04-30 DIAGNOSIS — Z974 Presence of external hearing-aid: Secondary | ICD-10-CM | POA: Diagnosis not present

## 2021-04-30 DIAGNOSIS — H61303 Acquired stenosis of external ear canal, unspecified, bilateral: Secondary | ICD-10-CM | POA: Diagnosis not present

## 2021-05-15 DIAGNOSIS — H61303 Acquired stenosis of external ear canal, unspecified, bilateral: Secondary | ICD-10-CM | POA: Diagnosis not present

## 2021-05-15 DIAGNOSIS — Z974 Presence of external hearing-aid: Secondary | ICD-10-CM | POA: Diagnosis not present

## 2021-05-15 DIAGNOSIS — H906 Mixed conductive and sensorineural hearing loss, bilateral: Secondary | ICD-10-CM | POA: Diagnosis not present

## 2021-05-15 DIAGNOSIS — Z9889 Other specified postprocedural states: Secondary | ICD-10-CM | POA: Diagnosis not present

## 2021-05-28 DIAGNOSIS — Z461 Encounter for fitting and adjustment of hearing aid: Secondary | ICD-10-CM | POA: Diagnosis not present

## 2021-05-28 DIAGNOSIS — H9072 Mixed conductive and sensorineural hearing loss, unilateral, left ear, with unrestricted hearing on the contralateral side: Secondary | ICD-10-CM | POA: Diagnosis not present

## 2021-05-28 DIAGNOSIS — Z888 Allergy status to other drugs, medicaments and biological substances status: Secondary | ICD-10-CM | POA: Diagnosis not present

## 2021-06-11 DIAGNOSIS — H90A32 Mixed conductive and sensorineural hearing loss, unilateral, left ear with restricted hearing on the contralateral side: Secondary | ICD-10-CM | POA: Diagnosis not present

## 2021-06-11 DIAGNOSIS — Z888 Allergy status to other drugs, medicaments and biological substances status: Secondary | ICD-10-CM | POA: Diagnosis not present

## 2021-06-11 DIAGNOSIS — Z974 Presence of external hearing-aid: Secondary | ICD-10-CM | POA: Diagnosis not present

## 2021-06-11 DIAGNOSIS — Z9889 Other specified postprocedural states: Secondary | ICD-10-CM | POA: Diagnosis not present

## 2021-06-11 DIAGNOSIS — H61303 Acquired stenosis of external ear canal, unspecified, bilateral: Secondary | ICD-10-CM | POA: Diagnosis not present

## 2021-06-25 DIAGNOSIS — Z87891 Personal history of nicotine dependence: Secondary | ICD-10-CM | POA: Diagnosis not present

## 2021-06-25 DIAGNOSIS — H9072 Mixed conductive and sensorineural hearing loss, unilateral, left ear, with unrestricted hearing on the contralateral side: Secondary | ICD-10-CM | POA: Diagnosis not present

## 2021-06-25 DIAGNOSIS — Z974 Presence of external hearing-aid: Secondary | ICD-10-CM | POA: Diagnosis not present

## 2021-06-25 DIAGNOSIS — Z7952 Long term (current) use of systemic steroids: Secondary | ICD-10-CM | POA: Diagnosis not present

## 2021-06-26 NOTE — Progress Notes (Signed)
New Patient Note  RE: Damon Weaver MRN: [id number] DOB: [birth year] Date of Office Visit: 06/27/2021  Consult requested by: No ref. provider found Primary care provider: Gaynelle Arabian, MD  Chief Complaint: Other (Has issues with his hearing, Damon Weaver was referred for possible mold allergies. That could be the cause of his hearing loss. Last saw his doctor 2 days ago. ) and Sinus Problem (Some sinus pressure not sure the cause )  History of Present Illness: I had the pleasure of seeing Damon Weaver for initial evaluation at the [facility] on 06/28/2021. Damon Weaver is a 76 y.o. male, who is referred here by Dr. Willette Pa (ENT) for the evaluation of possible allergies.  Damon Weaver reports symptoms of issues with his ears draining for 10+ years. Damon Weaver saw ENT and had surgery to help with the narrow ear canal which did not improve his symptoms.  Damon Weaver mentions having dental implants done about 10+ years ago but can't recall if Damon Weaver had this problem with his ears before or after the implants were placed.  Damon Weaver had X-rays at his dentist done and was told everything looks fine.  Patient also wears hearing aids bilaterally for 5-7 years.   Denies any issues with metals - wears rings, watches and has a hip replacement with no issues.  Denies any frequent infections requiring antibiotics all the time.   Occasionally Damon Weaver has some nasal congestion.  The symptoms are present all year around. Other triggers include exposure to unknown. Anosmia: no. Headache: no. Damon Weaver has used benadryl with some improvement in symptoms. Sinus infections: no. Previous work up includes: none. History of reflux: no.  05/28/2021 ENT visit: "Damon Weaver is a 77 y.o. male is seen in follow up of ear canal stenosis.  Initially seen 06/12/2020 Patient reports 10+ years of hearing loss in both ears, for which Damon Weaver wears hearing aides. Over this time, Damon Weaver has had increasing frequency of impactions requiring cleanings, which are now  required at least every 2 months. Without cleaning, the ears feel full and hear hearing is decreased. Even when cleaned, recently the ears have still felt full and moist.  Interval Patient is now s/p 02/01/2021 left canalplasty/meatoplasty with split thickness skin graft. Last seen 05/15/2021 with continued granulation of the left ear. Continued left moisture. Right ear feels like aid does not fit as well. Using drops  76 y.o. male with: 1. Left worse than right severe mixed hearing loss 2. Bilateral external auditory canal stenosis 3. Left canalplasty, meatoplasty, skin graft 02/01/2021 4. Bilateral hearing aid use  Continued granulation and irritation of the left canal, this has been primarially a lateral issue with good epithelization of the graft medially, however on this exam the entire canal is more edematous. The right canal is more moist than prior. I remain concerned about a systemic/inflammatory process. The left ear was cauterized again and I instilled gentian violet and Lotrisone. Damon Weaver will resume drops in 3 days."  Assessment and Plan: Damon Weaver is a 76 y.o. male with: Acquired stenosis of both external ear canals Issues with ear drainage for 10+ years.  Around this time Damon Weaver also had dental implants placed.  Followed by ENT s/p left canalplasty/meatoplasty with split thickness skin graft.  Patient wears a hearing aid for 5+ years.  Some congestion symptoms as well.  No prior allergy evaluation. Today's skin prick testing showed: Positive to grass and trees only. Negative to foods.  Get bloodwork instead of intradermal testing to double check allergens that  were negative on skin prick testing.  Discussed with patient that the above allergens do not explain his symptoms.  Keep ENT appointment. Get bloodwork to look at immune system. Some concern for contact irritation from hearing aids or possible irritation from dental implants? No recent CT head/sinus - check with ENT if this is  something that they would recommend.  Consider patch testing next to metals and TRUE patch test if above work up unremarkable.   Other allergic rhinitis Some nasal congestion. Today's skin prick testing positive to grass and trees.  Start environmental control measures as below. Use over the counter antihistamines such as Zyrtec (cetirizine), Claritin (loratadine), Allegra (fexofenadine), or Xyzal (levocetirizine) daily as needed. May switch antihistamines every few months. Use Flonase (fluticasone) nasal spray 1 spray per nostril twice a day as needed for nasal congestion.   Return in about 3 months (around 09/25/2021).  Meds ordered this encounter  Medications   fluticasone (FLONASE) 50 MCG/ACT nasal spray    Sig: Place 1 spray into both nostrils 2 (two) times daily as needed (nasal congestion).    Dispense:  16 g    Refill:  5   Lab Orders         ANA w/Reflex         CBC with Differential/Platelet         C-reactive protein         Complement, total         Sedimentation rate         Strep pneumoniae 23 Serotypes IgG         Diphtheria / Tetanus Antibody Panel         IgG, IgA, IgM         IgG 1, 2, 3, and 4         Tryptase         Allergens w/Total IgE Area 2      Other allergy screening: Asthma: no Food allergy: no Medication allergy: no Hymenoptera allergy: no Urticaria: no Eczema:no History of recurrent infections suggestive of immunodeficency: no  Patient has no history multiple infections including sinus infection, pneumonia, ear infections, GI infections/diarrhea, skin infections/abscesses. Patient also has no history of opportunistic infections including fungal infections, viral infections.  No prior immune evaluation.   Patient takes prophylactic antibiotics prior to dental visits due to a hip implant.  Patient reports 0 antibiotic use in the last 12 months and 0 hospital admissions. Patient does not have any secondary causes of immunodeficiency including  chronic steroid use, diabetes mellitus, protein losing enteropathy, renal or hepatic dysfunction, history of cancer or irradiation or history of HIV, hepatitis B or C.  Diagnostics: Skin Testing: Environmental allergy panel and food panel. Positive to grass and trees only. Negative to foods.  Results discussed with patient/family.  Airborne Adult Perc - 06/27/21 1353     Time Antigen Placed 1353    Allergen Manufacturer Lavella Hammock    Location Back    Number of Test 59    Panel 1 Select    1. Control-Buffer 50% Glycerol Negative    2. Control-Histamine 1 mg/ml 2+    3. Albumin saline Negative    4. Jane Negative    5. [place] Negative    6. Johnson Negative    7. Kentucky Blue 2+    8. Meadow Fescue Negative    9. Perennial Rye Negative    10. Sweet Vernal Negative    11. Timothy 2+    12. Cocklebur  Negative    13. Burweed Marshelder Negative    14. Ragweed, short Negative    15. Ragweed, Giant Negative    16. Plantain,  English Negative    17. Lamb's Quarters Negative    18. Sheep Sorrell Negative    19. Rough Pigweed Negative    20. Marsh Elder, Rough Negative    21. Mugwort, Common Negative    22. Ash mix 2+    23. Birch mix Negative    24. Beech American Negative    25. Box, Elder Negative    26. Cedar, red Negative    27. Cottonwood, [place] Negative    28. Elm mix Negative    29. Hickory 2+    30. Maple mix 2+    31. Oak, [place] mix Negative    32. Pecan Pollen 2+    33. Pine mix Negative    34. Sycamore Eastern Negative    35. Haskins, Black Pollen Negative    36. Alternaria alternata Negative    37. Cladosporium Herbarum Negative    38. Aspergillus mix Negative    39. Penicillium mix Negative    40. Bipolaris sorokiniana (Helminthosporium) Negative    41. Drechslera spicifera (Curvularia) Negative    42. Mucor plumbeus Negative    43. Fusarium moniliforme Negative    44. Aureobasidium pullulans (pullulara) Negative    45. Rhizopus oryzae Negative    46.  Botrytis cinera Negative    47. Epicoccum nigrum Negative    48. Phoma betae Negative    49. Candida Albicans Negative    50. Trichophyton mentagrophytes Negative    51. Mite, D Farinae  5,000 AU/ml Negative    52. Mite, D Pteronyssinus  5,000 AU/ml Negative    53. Cat Hair 10,000 BAU/ml Negative    54.  Dog Epithelia Negative    55. Mixed Feathers Negative    56. Horse Epithelia Negative    57. Cockroach, German Negative    58. Mouse Negative    59. Tobacco Leaf Negative             Food Adult Perc - 06/27/21 1300     Time Antigen Placed 1353    Allergen Manufacturer Lavella Hammock    Location Back    Number of allergen test 72    1. Peanut Negative    2. Soybean Negative    3. Wheat Negative    4. Sesame Negative    5. Milk, cow Negative    6. Egg White, Chicken Negative    7. Casein Negative    8. Shellfish Mix Negative    9. Fish Mix Negative    10. Cashew Negative    11. Pecan Food Negative    12. [place] Negative    13. Almond Negative    14. Hazelnut Negative    15. Bol[place] nut Negative    16. Coconut Negative    17. Pistachio Negative    18. Catfish Negative    19. Bass Negative    20. Trout Negative    21. Tuna Negative    22. Salmon Negative    23. Flounder Negative    24. Codfish Negative    25. Shrimp Negative    26. Crab Negative    27. Lobster Negative    28. Oyster Negative    29. Scallops Negative    30. Barley Negative    31. Oat  Negative    32. Rye  Negative    33. Hops Negative  34. Rice Negative    35. Cottonseed Negative    36. Saccharomyces Cerevisiae  Negative    37. Pork Negative    38. [place] Meat Negative    39. Chicken Meat Negative    40. Beef Negative    41. Lamb Negative    42. Tomato Negative    43. White Potato Negative    44. Sweet Potato Negative    45. Pea, Green/English Negative    46. Navy Bean Negative    47. Mushrooms Negative    48. Avocado Negative    49. Onion Negative    50. Cabbage Negative    51.  Carrots Negative    52. Celery Negative    53. Corn Negative    54. Cucumber Negative    55. Grape (White seedless) Negative    56. Orange  Negative    57. Banana Negative    58. Apple Negative    59. Peach Negative    60. Strawberry Negative    61. Cantaloupe Negative    62. Watermelon Negative    63. Pineapple Negative    64. Chocolate/Cacao bean Negative    65. Karaya Gum Negative    66. Acacia (Arabic Gum) Negative    67. Cinnamon Negative    68. Nutmeg Negative    69. Ginger Negative    70. Garlic Negative    71. Pepper, black Negative    72. Mustard Negative             Past Medical History: Patient Active Problem List   Diagnosis Date Noted   Other allergic rhinitis 06/27/2021   Encounter for screening for diseases of the blood and blood-forming organs and certain disorders involving the immune mechanism 06/27/2021   Acquired stenosis of both external ear canals 06/27/2021   OSA (obstructive sleep apnea) 07/25/2015   DJD (degenerative joint disease) 06/27/2014   Persistent atrial fibrillation ([place]) 06/02/2014   HTN (hypertension) 06/02/2014   Left hip pain 12/12/2013   Past Medical History:  Diagnosis Date   Arthritis    Dysrhythmia    Afib   Hypertension    Numbness and tingling    Hx: of left leg   OSA (obstructive sleep apnea) 07/25/2015    Mild OSA overall with AHI 8.2/hr and moderate during REM sleep with AHI 21/hr   Seasonal allergies    Past Surgical History: Past Surgical History:  Procedure Laterality Date   CARDIOVERSION N/A 08/30/2014   Procedure: CARDIOVERSION;  Surgeon: Thayer Headings, MD;  Location: [facility] ENDOSCOPY;  Service: Cardiovascular;  Laterality: N/A;   COLONOSCOPY W/ BIOPSIES AND POLYPECTOMY     Hx; of   LUMBAR LAMINECTOMY/DECOMPRESSION MICRODISCECTOMY N/A 03/24/2013   Procedure: LUMBAR LAMINECTOMY/DECOMPRESSION MICRODISCECTOMY LUMBAR THREE-FOUR,FOUR-FIVE;  Surgeon: Eustace Moore, MD;  Location: [facility] NEURO ORS;  Service: Neurosurgery;   Laterality: N/A;   ROTATOR CUFF REPAIR     Hx of right arm   TOTAL HIP ARTHROPLASTY Left 06/27/2014   DR MURPHY   TOTAL HIP ARTHROPLASTY Left 06/27/2014   Procedure: LEFT TOTAL HIP ARTHROPLASTY ANTERIOR APPROACH;  Surgeon: Renette Butters, MD;  Location: [facility];  Service: Orthopedics;  Laterality: Left;   Medication List:  Current Outpatient Medications  Medication Sig Dispense Refill   fluticasone (FLONASE) 50 MCG/ACT nasal spray Place 1 spray into both nostrils 2 (two) times daily as needed (nasal congestion). 16 g 5   hydrochlorothiazide (HYDRODIURIL) 25 MG tablet TAKE 1 TABLET EVERY DAY 90 tablet 3   lisinopril (  PRINIVIL,ZESTRIL) 20 MG tablet Take 1 tablet (20 mg total) by mouth daily. 90 tablet 3   metoprolol tartrate (LOPRESSOR) 25 MG tablet TAKE 1 TABLET TWICE DAILY 180 tablet 3   potassium chloride SA (KLOR-CON) 20 MEQ tablet TAKE 1 TABLET EVERY DAY 90 tablet 3   pravastatin (PRAVACHOL) 40 MG tablet Take 40 mg by mouth at bedtime.      rivaroxaban (XARELTO) 20 MG TABS tablet Take 1 tablet (20 mg total) by mouth daily with supper. 90 tablet 3   No current facility-administered medications for this visit.   Allergies: Allergies  Allergen Reactions   Eliquis [Apixaban] Rash   Social History: Social History   Socioeconomic History   Marital status: Married    Spouse name: Not on file   Number of children: Not on file   Years of education: Not on file   Highest education level: Not on file  Occupational History   Not on file  Tobacco Use   Smoking status: Former    Packs/day: 2.00    Years: 16.00    Pack years: 32.00    Types: Cigarettes    Quit date: 09/12/1981    Years since quitting: 39.8   Smokeless tobacco: Never  Substance and Sexual Activity   Alcohol use: Yes    Comment: daily   Drug use: No   Sexual activity: Not on file  Other Topics Concern   Not on file  Social History Narrative   Not on file   Social Determinants of Health   Financial Resource  Strain: Not on file  Food Insecurity: Not on file  Transportation Needs: Not on file  Physical Activity: Not on file  Stress: Not on file  Social Connections: Not on file   Lives in a 74 year old house. Smoking: quit in 1985 Occupation: owner  Environmental History: Water Damage/mildew in the house: no Charity fundraiser in the family room: no Carpet in the bedroom: yes Heating: gas Cooling: central Pet: yes 1 dog  x 2 yrs  Family History: Family History  Problem Relation Age of Onset   Arthritis Other    Problem                               Relation Asthma                                   no Eczema                                no Food allergy                          no Allergic rhino conjunctivitis     no  Review of Systems  Constitutional:  Negative for appetite change, chills, fever and unexpected weight change.  HENT:  Positive for congestion, ear discharge, hearing loss, sinus pressure and tinnitus. Negative for ear pain and rhinorrhea.   Eyes:  Negative for itching.  Respiratory:  Negative for cough, chest tightness, shortness of breath and wheezing.   Cardiovascular:  Negative for chest pain.  Gastrointestinal:  Negative for abdominal pain.  Genitourinary:  Negative for difficulty urinating.  Skin:  Negative for rash.  Allergic/Immunologic: Positive for environmental allergies. Negative for food allergies.  Neurological:  Negative for headaches.   Objective: BP 120/86    Pulse 88    Temp 97.6 F (36.4 C)    Resp 18    Ht 5\' 10"  (1.778 m)    Wt 262 lb (118.8 kg)    SpO2 99%    BMI 37.59 kg/m  Body mass index is 37.59 kg/m. Physical Exam Vitals and nursing note reviewed.  Constitutional:      Appearance: Normal appearance. Damon Weaver is well-developed.  HENT:     Head: Normocephalic and atraumatic.     Ears:     Comments: Narrow canal on the right side with some erythema and drainage. Left side is more patent than right but still has some erythema.     Nose: Nose  normal.     Mouth/Throat:     Mouth: Mucous membranes are moist.     Pharynx: Oropharynx is clear.  Eyes:     Conjunctiva/sclera: Conjunctivae normal.  Cardiovascular:     Rate and Rhythm: Normal rate and regular rhythm.     Heart sounds: Normal heart sounds. No murmur heard.   No friction rub. No gallop.  Pulmonary:     Effort: Pulmonary effort is normal.     Breath sounds: Normal breath sounds. No wheezing, rhonchi or rales.  Musculoskeletal:     Cervical back: Neck supple.  Skin:    General: Skin is warm.     Findings: No rash.  Neurological:     Mental Status: Damon Weaver is alert and oriented to person, place, and time.  Psychiatric:        Behavior: Behavior normal.  The plan was reviewed with the patient/family, and all questions/concerned were addressed.  It was my pleasure to see Damon Weaver today and participate in his care. Please feel free to contact me with any questions or concerns.  Sincerely,  Rexene Alberts, DO Allergy & Immunology  [facility] office: [place] office: [phone]

## 2021-06-27 ENCOUNTER — Other Ambulatory Visit: Payer: Self-pay

## 2021-06-27 ENCOUNTER — Encounter: Payer: Self-pay | Admitting: Allergy

## 2021-06-27 ENCOUNTER — Ambulatory Visit (INDEPENDENT_AMBULATORY_CARE_PROVIDER_SITE_OTHER): Payer: Medicare HMO | Admitting: Allergy

## 2021-06-27 VITALS — BP 120/86 | HR 88 | Temp 97.6°F | Resp 18 | Ht 70.0 in | Wt 262.0 lb

## 2021-06-27 DIAGNOSIS — J3089 Other allergic rhinitis: Secondary | ICD-10-CM | POA: Diagnosis not present

## 2021-06-27 DIAGNOSIS — H61303 Acquired stenosis of external ear canal, unspecified, bilateral: Secondary | ICD-10-CM | POA: Insufficient documentation

## 2021-06-27 DIAGNOSIS — Z130 Encounter for screening for diseases of the blood and blood-forming organs and certain disorders involving the immune mechanism: Secondary | ICD-10-CM

## 2021-06-27 MED ORDER — FLUTICASONE PROPIONATE 50 MCG/ACT NA SUSP: 1.0000 | Freq: Two times a day (BID) | NASAL | 5 refills | Status: AC | PRN

## 2021-06-27 NOTE — Patient Instructions (Addendum)
Today's skin testing showed: Positive to grass and trees only. Negative to foods.  Results given.   Ears  Keep ENT appointment. Get bloodwork to look at immune system. We are ordering labs, so please allow 1-2 weeks for the results to come back. With the newly implemented Cures Act, the labs might be visible to you at the same time that they become visible to me. However, I will not address the results until all of the results are back, so please be patient.  In the meantime, continue recommendations in your patient instructions, including avoidance measures (if applicable), until you hear from me.  Consider patch testing next to metals and chemicals. Patches are best placed on Monday with return to office on Wednesday and Friday of same week for readings.  Patches once placed should not get wet.  You do not have to stop any medications for patch testing but should not be on oral prednisone. You can schedule a patch testing visit when convenient for your schedule.    Environmental allergies Start environmental control measures as below. Use over the counter antihistamines such as Zyrtec (cetirizine), Claritin (loratadine), Allegra (fexofenadine), or Xyzal (levocetirizine) daily as needed. May switch antihistamines every few months. Use Flonase (fluticasone) nasal spray 1 spray per nostril twice a day as needed for nasal congestion.   Follow up in 3 months or sooner if needed.    Reducing Pollen Exposure Pollen seasons: trees (spring), grass (summer) and ragweed/weeds (fall). Keep windows closed in your home and car to lower pollen exposure.  Install air conditioning in the bedroom and throughout the house if possible.  Avoid going out in dry windy days - especially early morning. Pollen counts are highest between 5 - 10 AM and on dry, hot and windy days.  Save outside activities for late afternoon or after a heavy rain, when pollen levels are lower.  Avoid mowing of grass if you have  grass pollen allergy. Be aware that pollen can also be transported indoors on people and pets.  Dry your clothes in an automatic dryer rather than hanging them outside where they might collect pollen.  Rinse hair and eyes before bedtime.

## 2021-06-27 NOTE — Assessment & Plan Note (Deleted)
Issues with ear drainage for 10+ years.  Around this time he also had dental implants placed.  Followed by ENT for narrow ear canals s/p left canalplasty/meatoplasty with split thickness skin graft.  Patient wears a hearing aid.  Some congestion symptoms as well.  No prior allergy evaluation.  Today's skin prick testing showed: Positive to grass and trees only. Negative to foods.   Discussed with patient that the above allergens do not explain his symptoms.   Keep ENT appointment.  Get bloodwork to look at immune system.  Some concern for contact irritation from hearing aids or possible irritation/complication from dental implants? o No recent CT head/sinus - check with ENT if this is something that they would recommend.   Consider patch testing next to metals and chemicals if work up is negative.   Start environmental control measures as below.  Use over the counter antihistamines such as Zyrtec (cetirizine), Claritin (loratadine), Allegra (fexofenadine), or Xyzal (levocetirizine) daily as needed. May switch antihistamines every few months.  Use Flonase (fluticasone) nasal spray 1 spray per nostril twice a day as needed for nasal congestion.

## 2021-06-28 DIAGNOSIS — Z130 Encounter for screening for diseases of the blood and blood-forming organs and certain disorders involving the immune mechanism: Secondary | ICD-10-CM | POA: Diagnosis not present

## 2021-06-28 DIAGNOSIS — J3089 Other allergic rhinitis: Secondary | ICD-10-CM | POA: Diagnosis not present

## 2021-06-28 NOTE — Assessment & Plan Note (Addendum)
Issues with ear drainage for 10+ years.  Around this time he also had dental implants placed.  Followed by ENT s/p left canalplasty/meatoplasty with split thickness skin graft.  Patient wears a hearing aid for 5+ years.  Some congestion symptoms as well.  No prior allergy evaluation.  Today's skin prick testing showed: Positive to grass and trees only. Negative to foods.   Get bloodwork instead of intradermal testing to double check allergens that were negative on skin prick testing.   Discussed with patient that the above allergens do not explain his symptoms.   Keep ENT appointment.  Get bloodwork to look at immune system.  Some concern for contact irritation from hearing aids or possible irritation from dental implants? o No recent CT head/sinus - check with ENT if this is something that they would recommend.   Consider patch testing next to metals and TRUE patch test if above work up unremarkable.

## 2021-06-28 NOTE — Assessment & Plan Note (Addendum)
Some nasal congestion.  Today's skin prick testing positive to grass and trees.   Start environmental control measures as below.  Use over the counter antihistamines such as Zyrtec (cetirizine), Claritin (loratadine), Allegra (fexofenadine), or Xyzal (levocetirizine) daily as needed. May switch antihistamines every few months.  Use Flonase (fluticasone) nasal spray 1 spray per nostril twice a day as needed for nasal congestion.

## 2021-06-28 NOTE — Assessment & Plan Note (Deleted)
.   See assessment and plan as above. 

## 2021-07-10 LAB — DIPHTHERIA / TETANUS ANTIBODY PANEL
Diphtheria Ab: 0.22 IU/mL (ref <0.10)
Tetanus Ab, IgG: >7 IU/mL (ref <0.10)

## 2021-07-10 LAB — STREP PNEUMONIAE 23 SEROTYPES IGG
Pneumo Ab Type 1*: 0.5 ug/mL — ABNORMAL LOW (ref >1.3)
Pneumo Ab Type 12 (12F)*: 0.6 ug/mL — ABNORMAL LOW (ref >1.3)
Pneumo Ab Type 14*: 1.1 ug/mL — ABNORMAL LOW (ref >1.3)
Pneumo Ab Type 17 (17F)*: 4.1 ug/mL (ref >1.3)
Pneumo Ab Type 19 (19F)*: 2.9 ug/mL (ref >1.3)
Pneumo Ab Type 2*: 2.8 ug/mL (ref >1.3)
Pneumo Ab Type 20*: 0.3 ug/mL — ABNORMAL LOW (ref >1.3)
Pneumo Ab Type 22 (22F)*: 0.9 ug/mL — ABNORMAL LOW (ref >1.3)
Pneumo Ab Type 23 (23F)*: 0.4 ug/mL — ABNORMAL LOW (ref >1.3)
Pneumo Ab Type 26 (6B)*: 2.1 ug/mL (ref >1.3)
Pneumo Ab Type 3*: 0.7 ug/mL — ABNORMAL LOW (ref >1.3)
Pneumo Ab Type 34 (10A)*: 11.1 ug/mL (ref >1.3)
Pneumo Ab Type 4*: 0.8 ug/mL — ABNORMAL LOW (ref >1.3)
Pneumo Ab Type 43 (11A)*: 0.2 ug/mL — ABNORMAL LOW (ref >1.3)
Pneumo Ab Type 5*: 0.2 ug/mL — ABNORMAL LOW (ref >1.3)
Pneumo Ab Type 51 (7F)*: >13.6 ug/mL (ref >1.3)
Pneumo Ab Type 54 (15B)*: 2.4 ug/mL (ref >1.3)
Pneumo Ab Type 56 (18C)*: 1.6 ug/mL (ref >1.3)
Pneumo Ab Type 57 (19A)*: 3.2 ug/mL (ref >1.3)
Pneumo Ab Type 68 (9V)*: 1.2 ug/mL — ABNORMAL LOW (ref >1.3)
Pneumo Ab Type 70 (33F)*: 6 ug/mL (ref >1.3)
Pneumo Ab Type 8*: 0.5 ug/mL — ABNORMAL LOW (ref >1.3)
Pneumo Ab Type 9 (9N)*: 0.2 ug/mL — ABNORMAL LOW (ref >1.3)

## 2021-07-10 LAB — CBC WITH DIFFERENTIAL/PLATELET
Basophils Absolute: 0.1 10*3/uL (ref 0.0–0.2)
Basos: 1 %
EOS (ABSOLUTE): 0.1 10*3/uL (ref 0.0–0.4)
Eos: 2 %
Hematocrit: 42.7 % (ref 37.5–51.0)
Hemoglobin: 14.6 g/dL (ref 13.0–17.7)
Immature Grans (Abs): 0 10*3/uL (ref 0.0–0.1)
Immature Granulocytes: 0 %
Lymphocytes Absolute: 0.9 10*3/uL (ref 0.7–3.1)
Lymphs: 16 %
MCH: 32.7 pg (ref 26.6–33.0)
MCHC: 34.2 g/dL (ref 31.5–35.7)
MCV: 96 fL (ref 79–97)
Monocytes Absolute: 0.6 10*3/uL (ref 0.1–0.9)
Monocytes: 11 %
Neutrophils Absolute: 4.1 10*3/uL (ref 1.4–7.0)
Neutrophils: 70 %
Platelets: 172 10*3/uL (ref 150–450)
RBC: 4.46 x10E6/uL (ref 4.14–5.80)
RDW: 11.9 % (ref 11.6–15.4)
WBC: 5.9 10*3/uL (ref 3.4–10.8)

## 2021-07-10 LAB — SEDIMENTATION RATE: Sed Rate: 16 mm/hr (ref 0–30)

## 2021-07-10 LAB — ALLERGENS W/TOTAL IGE AREA 2

## 2021-07-10 LAB — COMPLEMENT, TOTAL: Compl, Total (CH50): >60 U/mL (ref >41)

## 2021-07-10 LAB — IGG 1, 2, 3, AND 4
IgG (Immunoglobin G), Serum: 1207 mg/dL (ref 603–1613)
IgG, Subclass 1: 710 mg/dL (ref 248–810)
IgG, Subclass 2: 172 mg/dL (ref 130–555)
IgG, Subclass 3: 41 mg/dL (ref 15–102)
IgG, Subclass 4: 28 mg/dL (ref 2–96)

## 2021-07-10 LAB — TRYPTASE: Tryptase: 6 ug/L (ref 2.2–13.2)

## 2021-07-10 LAB — ANA W/REFLEX: Anti Nuclear Antibody (ANA): NEGATIVE

## 2021-07-10 LAB — C-REACTIVE PROTEIN: CRP: 3 mg/L (ref 0–10)

## 2021-07-10 LAB — IGG, IGA, IGM
IgA/Immunoglobulin A, Serum: 308 mg/dL (ref 61–437)
IgM (Immunoglobulin M), Srm: 96 mg/dL (ref 15–143)

## 2021-07-17 DIAGNOSIS — Z9889 Other specified postprocedural states: Secondary | ICD-10-CM | POA: Diagnosis not present

## 2021-07-17 DIAGNOSIS — Z974 Presence of external hearing-aid: Secondary | ICD-10-CM | POA: Diagnosis not present

## 2021-07-17 DIAGNOSIS — H906 Mixed conductive and sensorineural hearing loss, bilateral: Secondary | ICD-10-CM | POA: Diagnosis not present

## 2021-08-05 DIAGNOSIS — U071 COVID-19: Secondary | ICD-10-CM | POA: Diagnosis not present

## 2021-08-13 DIAGNOSIS — Z974 Presence of external hearing-aid: Secondary | ICD-10-CM | POA: Diagnosis not present

## 2021-08-13 DIAGNOSIS — H906 Mixed conductive and sensorineural hearing loss, bilateral: Secondary | ICD-10-CM | POA: Diagnosis not present

## 2021-08-13 DIAGNOSIS — H61303 Acquired stenosis of external ear canal, unspecified, bilateral: Secondary | ICD-10-CM | POA: Diagnosis not present

## 2021-08-13 DIAGNOSIS — Z9889 Other specified postprocedural states: Secondary | ICD-10-CM | POA: Diagnosis not present

## 2021-08-13 DIAGNOSIS — Z888 Allergy status to other drugs, medicaments and biological substances status: Secondary | ICD-10-CM | POA: Diagnosis not present

## 2021-08-14 DIAGNOSIS — H906 Mixed conductive and sensorineural hearing loss, bilateral: Secondary | ICD-10-CM | POA: Diagnosis not present

## 2021-10-08 DIAGNOSIS — L280 Lichen simplex chronicus: Secondary | ICD-10-CM | POA: Diagnosis not present

## 2021-10-08 DIAGNOSIS — L570 Actinic keratosis: Secondary | ICD-10-CM | POA: Diagnosis not present

## 2021-10-08 DIAGNOSIS — X32XXXA Exposure to sunlight, initial encounter: Secondary | ICD-10-CM | POA: Diagnosis not present

## 2021-10-16 ENCOUNTER — Ambulatory Visit: Payer: Medicare HMO | Admitting: Allergy

## 2021-10-18 DIAGNOSIS — Z86010 Personal history of colonic polyps: Secondary | ICD-10-CM | POA: Diagnosis not present

## 2021-10-18 DIAGNOSIS — Z125 Encounter for screening for malignant neoplasm of prostate: Secondary | ICD-10-CM | POA: Diagnosis not present

## 2021-10-18 DIAGNOSIS — E7800 Pure hypercholesterolemia, unspecified: Secondary | ICD-10-CM | POA: Diagnosis not present

## 2021-10-18 DIAGNOSIS — I10 Essential (primary) hypertension: Secondary | ICD-10-CM | POA: Diagnosis not present

## 2021-10-18 DIAGNOSIS — D6869 Other thrombophilia: Secondary | ICD-10-CM | POA: Diagnosis not present

## 2021-10-18 DIAGNOSIS — I4891 Unspecified atrial fibrillation: Secondary | ICD-10-CM | POA: Diagnosis not present

## 2021-10-18 DIAGNOSIS — Z1389 Encounter for screening for other disorder: Secondary | ICD-10-CM | POA: Diagnosis not present

## 2021-10-18 DIAGNOSIS — Z0000 Encounter for general adult medical examination without abnormal findings: Secondary | ICD-10-CM | POA: Diagnosis not present

## 2021-10-18 DIAGNOSIS — E871 Hypo-osmolality and hyponatremia: Secondary | ICD-10-CM | POA: Diagnosis not present

## 2021-10-18 DIAGNOSIS — G4733 Obstructive sleep apnea (adult) (pediatric): Secondary | ICD-10-CM | POA: Diagnosis not present

## 2021-10-22 DIAGNOSIS — Z974 Presence of external hearing-aid: Secondary | ICD-10-CM | POA: Diagnosis not present

## 2021-10-22 DIAGNOSIS — H61303 Acquired stenosis of external ear canal, unspecified, bilateral: Secondary | ICD-10-CM | POA: Diagnosis not present

## 2021-10-22 DIAGNOSIS — H903 Sensorineural hearing loss, bilateral: Secondary | ICD-10-CM | POA: Diagnosis not present

## 2021-10-22 DIAGNOSIS — H906 Mixed conductive and sensorineural hearing loss, bilateral: Secondary | ICD-10-CM | POA: Diagnosis not present

## 2021-12-17 ENCOUNTER — Ambulatory Visit: Payer: Medicare HMO | Admitting: Cardiovascular Disease

## 2021-12-17 ENCOUNTER — Encounter: Payer: Self-pay | Admitting: Cardiovascular Disease

## 2021-12-17 VITALS — BP 124/76 | HR 80 | Ht 70.0 in | Wt 260.2 lb

## 2021-12-17 DIAGNOSIS — I272 Pulmonary hypertension, unspecified: Secondary | ICD-10-CM | POA: Diagnosis not present

## 2021-12-17 DIAGNOSIS — I4819 Other persistent atrial fibrillation: Secondary | ICD-10-CM

## 2021-12-17 DIAGNOSIS — I4891 Unspecified atrial fibrillation: Secondary | ICD-10-CM | POA: Diagnosis not present

## 2021-12-25 ENCOUNTER — Other Ambulatory Visit: Payer: Self-pay | Admitting: Cardiovascular Disease

## 2022-01-14 DIAGNOSIS — H61303 Acquired stenosis of external ear canal, unspecified, bilateral: Secondary | ICD-10-CM | POA: Diagnosis not present

## 2022-01-14 DIAGNOSIS — H9212 Otorrhea, left ear: Secondary | ICD-10-CM | POA: Diagnosis not present

## 2022-03-17 DIAGNOSIS — H938X3 Other specified disorders of ear, bilateral: Secondary | ICD-10-CM | POA: Diagnosis not present

## 2022-03-22 DIAGNOSIS — Z23 Encounter for immunization: Secondary | ICD-10-CM | POA: Diagnosis not present

## 2022-03-31 ENCOUNTER — Other Ambulatory Visit: Payer: Self-pay | Admitting: Cardiovascular Disease

## 2022-05-13 DIAGNOSIS — X32XXXD Exposure to sunlight, subsequent encounter: Secondary | ICD-10-CM | POA: Diagnosis not present

## 2022-05-13 DIAGNOSIS — L570 Actinic keratosis: Secondary | ICD-10-CM | POA: Diagnosis not present

## 2022-05-26 DIAGNOSIS — H61301 Acquired stenosis of right external ear canal, unspecified: Secondary | ICD-10-CM | POA: Diagnosis not present

## 2022-05-26 DIAGNOSIS — H938X3 Other specified disorders of ear, bilateral: Secondary | ICD-10-CM | POA: Diagnosis not present

## 2022-07-29 DIAGNOSIS — Z9889 Other specified postprocedural states: Secondary | ICD-10-CM | POA: Diagnosis not present

## 2022-07-29 DIAGNOSIS — H61303 Acquired stenosis of external ear canal, unspecified, bilateral: Secondary | ICD-10-CM | POA: Diagnosis not present

## 2022-07-29 DIAGNOSIS — Z974 Presence of external hearing-aid: Secondary | ICD-10-CM | POA: Diagnosis not present

## 2022-07-29 DIAGNOSIS — H938X3 Other specified disorders of ear, bilateral: Secondary | ICD-10-CM | POA: Diagnosis not present

## 2022-09-29 DIAGNOSIS — H6123 Impacted cerumen, bilateral: Secondary | ICD-10-CM | POA: Diagnosis not present

## 2022-10-21 DIAGNOSIS — E871 Hypo-osmolality and hyponatremia: Secondary | ICD-10-CM | POA: Diagnosis not present

## 2022-10-21 DIAGNOSIS — E7800 Pure hypercholesterolemia, unspecified: Secondary | ICD-10-CM | POA: Diagnosis not present

## 2022-10-21 DIAGNOSIS — D6869 Other thrombophilia: Secondary | ICD-10-CM | POA: Diagnosis not present

## 2022-10-21 DIAGNOSIS — I4891 Unspecified atrial fibrillation: Secondary | ICD-10-CM | POA: Diagnosis not present

## 2022-10-21 DIAGNOSIS — Z0000 Encounter for general adult medical examination without abnormal findings: Secondary | ICD-10-CM | POA: Diagnosis not present

## 2022-10-21 DIAGNOSIS — I10 Essential (primary) hypertension: Secondary | ICD-10-CM | POA: Diagnosis not present

## 2022-10-21 DIAGNOSIS — Z86010 Personal history of colonic polyps: Secondary | ICD-10-CM | POA: Diagnosis not present

## 2022-10-21 DIAGNOSIS — Z1331 Encounter for screening for depression: Secondary | ICD-10-CM | POA: Diagnosis not present

## 2022-10-21 LAB — LAB REPORT - SCANNED: EGFR: 77

## 2022-12-01 DIAGNOSIS — H906 Mixed conductive and sensorineural hearing loss, bilateral: Secondary | ICD-10-CM | POA: Diagnosis not present

## 2022-12-01 DIAGNOSIS — H61303 Acquired stenosis of external ear canal, unspecified, bilateral: Secondary | ICD-10-CM | POA: Diagnosis not present

## 2022-12-01 DIAGNOSIS — H938X1 Other specified disorders of right ear: Secondary | ICD-10-CM | POA: Diagnosis not present

## 2022-12-17 ENCOUNTER — Telehealth: Payer: Self-pay | Admitting: *Deleted

## 2022-12-17 NOTE — Telephone Encounter (Signed)
   Pre-operative Risk Assessment    Patient Name: Damon Weaver  DOB: 06-12-1946 MRN: [id number]      Request for Surgical Clearance    Procedure:   COLONOSCOPY  Date of Surgery:  Clearance 01/27/23                                 Surgeon:  DR. Dulce Sellar Surgeon's Group or Practice Name:  [facility] Phone number:  [phone] Fax number:  [phone]   Type of Clearance Requested:   - Medical  - Pharmacy:  Hold Rivaroxaban (Xarelto)     Type of Anesthesia:   PROPOFOL   Additional requests/questions:    Elpidio Anis   12/17/2022, 5:46 PM

## 2022-12-18 NOTE — Telephone Encounter (Signed)
   Name: SHERVIN CYPERT  DOB: 28-Oct-1945  MRN: [id number]  Primary Cardiologist: Kristeen Miss, MD  Chart reviewed as part of pre-operative protocol coverage. The patient has an upcoming visit scheduled with Dr. Elease Hashimoto on 12/29/22 at which time clearance can be addressed in case there are any issues that would impact surgical recommendations.  I added preop FYI to appointment note so that provider is aware to address at time of outpatient visit.  Per office protocol the cardiology provider should forward their finalized clearance decision and recommendations regarding antiplatelet therapy to the requesting party below.    Patient will need updated CBC before guidance can be offered for holding Xarelto.  CBC added to appointment notes to be completed at follow-up.  I will route this message as FYI to requesting party and remove this message from the preop box as separate preop APP input not needed at this time.   Please call with any questions.  Napoleon Form, Leodis Rains, NP  12/18/2022, 7:46 AM

## 2022-12-18 NOTE — Telephone Encounter (Signed)
I will update all parties involved pt has appt with Dr. Elease Hashimoto 12/29/22.

## 2022-12-28 ENCOUNTER — Encounter: Payer: Self-pay | Admitting: Cardiovascular Disease

## 2022-12-28 NOTE — Progress Notes (Unsigned)
Cardiology Office Note   Date:  12/29/2022   ID:  Damon Weaver, DOB 02/09/1946, MRN [id number]  PCP:  Blair Heys, MD  Cardiologist:   Kristeen Miss, MD   Problem List  1. Atrial fib- CHADS2VASC score of 2 ( age,  HTN)  2. Hypertension 3. Hyperlipidemia 4. Aortic Insufficiency  - Moderate  AI .  5. Mild MR:    Chief Complaint  Patient presents with   Atrial Fibrillation       Damon Weaver is a 77 y.o. male who presents for follow up of his atrial fib We increased his coreg during his last visit.  He had a drug rash previously that has resolved when we switch from Eliquis to Xarelto.   He has had a left hip replacement since i last saw him Still has lots of pain. Has had lots of diffuse swelling in his left leg  He was told that this is normal following hip replacement.  No CP , no dyspnea.  No dizziness.   He was late taking a Xarelto dose several days ago  August 22, 2014:  Damon Weaver is doing ok.  Tolerating the afib well. Ready for cardioversion.  Has been taking the Xarelto. Has had persistent swelling in his left leg following hip replacement.  Venous dopplers were negative for DVT  October 10, 2014:  Damon Weaver is seen in follow up today . Had cardioversion last month.  Still feels well.    Nov. 4, 2016:  Damon Weaver is seen today for follow up of his atrial fib.  Has been cardioverted in the past.  Went back into atrial fib after a couple of days  Still fairly asymptomatic. Notices a bit more fatigue but otherwise is doing ok Trying to walk more .  Has some shortness of breath walking up a long hill.   Oct 24, 2015: Doing well Mild DOE with walking  Is working out more   January 05, 2018  Has mild DOE walking up a hill No DOE with normal walking  Not getting as much exercise as he would like   Aug. 24, 2020 Damon Weaver is seen today for follow-up of his atrial fibrillation.  His CHADS2VASC  score is 2 ( age, HTN) .  He has a history of hypertension, hyperlipidemia,  moderate aortic insufficiency.  I saw him for virtual visit on July 19.  He was having increasing shortness of breath. Dyspnea is about the same   Has had a sleep study .    Was not fitted with a CPAP     Advised weight loss .   Aug. 24, 2021: Damon Weaver is seen for follow up of his Afib.   He has a hx of AI, HLD, HTN Still in Afib BP is a bit elevated.  Does not avoid salt .  Is fairly active ,  Walks his dog regularly ( new  golden doodle)  Still working ,  Has an Nurse, learning disability business.   December 17, 2021: Damon Weaver is seen today for follow-up of his atrial fibrillation, hypertension, hyperlipidemia.  He has mild to moderate aortic insufficiency.  He was last seen 2 years ago.  Getting some exercise ,  No cp, no dyspnea  -   Encouraged him to cont to work oiut Cont to work on [employer] loss    December 29, 2022 Damon Weaver is seen for follow up of his atrial fib, HTN, HLD  Has mild - mod AI Is here for pre op evaluation for  colonoscopy .  He will need to hold his Xarelto for several days prior to and after his procedure     Past Medical History:  Diagnosis Date   Arthritis    Dysrhythmia    Afib   Hypertension    Numbness and tingling    Hx: of left leg   OSA (obstructive sleep apnea) 07/25/2015    Mild OSA overall with AHI 8.2/hr and moderate during REM sleep with AHI 21/hr   Seasonal allergies     Past Surgical History:  Procedure Laterality Date   CARDIOVERSION N/A 08/30/2014   Procedure: CARDIOVERSION;  Surgeon: Vesta Mixer, MD;  Location[facility] ENDOSCOPY;  Service: Cardiovascular;  Laterality: N/A;   COLONOSCOPY W/ BIOPSIES AND POLYPECTOMY     Hx; of   LUMBAR LAMINECTOMY/DECOMPRESSION MICRODISCECTOMY N/A 03/24/2013   Procedure: LUMBAR LAMINECTOMY/DECOMPRESSION MICRODISCECTOMY LUMBAR THREE-FOUR,FOUR-FIVE;  Surgeon: Tia Alert, MD;  Location: [facility] NEURO ORS;  Service: Neurosurgery;  Laterality: N/A;   ROTATOR CUFF REPAIR     Hx of right arm   TOTAL HIP ARTHROPLASTY Left 06/27/2014   DR MURPHY    TOTAL HIP ARTHROPLASTY Left 06/27/2014   Procedure: LEFT TOTAL HIP ARTHROPLASTY ANTERIOR APPROACH;  Surgeon: Sheral Apley, MD;  Location: [facility] OR;  Service: Orthopedics;  Laterality: Left;     Current Outpatient Medications  Medication Sig Dispense Refill   fluticasone (FLONASE) 50 MCG/ACT nasal spray Place 1 spray into both nostrils 2 (two) times daily as needed (nasal congestion). 16 g 5   hydrochlorothiazide (HYDRODIURIL) 25 MG tablet TAKE 1 TABLET EVERY DAY 90 tablet 1   lisinopril (ZESTRIL) 40 MG tablet Take 40 mg by mouth daily.     metoprolol tartrate (LOPRESSOR) 25 MG tablet TAKE 1 TABLET TWICE DAILY 180 tablet 3   neomycin-polymyxin-hydrocortisone (CORTISPORIN) OTIC solution Place 4 drops into both ears 2 (two) times daily.     potassium chloride SA (KLOR-CON M) 20 MEQ tablet TAKE 1 TABLET EVERY DAY 90 tablet 3   pravastatin (PRAVACHOL) 40 MG tablet Take 40 mg by mouth at bedtime.      rivaroxaban (XARELTO) 20 MG TABS tablet Take 1 tablet (20 mg total) by mouth daily with supper. 90 tablet 3   amoxicillin (AMOXIL) 500 MG tablet take 4  tablets 1 hour before dental procedure (Patient not taking: Reported on 12/17/2021)     No current facility-administered medications for this visit.    Allergies:   Amlodipine and Eliquis [apixaban]    Social History:  The patient  reports that he quit smoking about 41 years ago. His smoking use included cigarettes. He has a 32.00 pack-year smoking history. He has never used smokeless tobacco. He reports current alcohol use. He reports that he does not use drugs.   Family History:  The patient's family history includes Arthritis in an other family member.    ROS:  Please see the history of present illness.     Physical Exam: Blood pressure 102/60, pulse 85, height 5\' 10"  (1.778 m), weight 256 lb 12.8 oz (116.5 kg), SpO2 98 %.       GEN:  Well nourished, moderately obese  in no acute distress HEENT: Normal NECK: No JVD; No carotid  bruits LYMPHATICS: No lymphadenopathy CARDIAC: Irreg irreg.  , soft systolic and diastolic murmurs  RESPIRATORY:  Clear to auscultation without rales, wheezing or rhonchi  ABDOMEN: Soft, non-tender, non-distended MUSCULOSKELETAL:  No edema; No deformity  SKIN: Warm and dry NEUROLOGIC:  Alert and oriented x 3  EKG:       EKG Interpretation Date/Time:  Monday December 29 2022 15:00:03 EDT Ventricular Rate:  85 PR Interval:    QRS Duration:  90 QT Interval:  370 QTC Calculation: 440 R Axis:   62  Text Interpretation: Atrial fibrillation When compared with ECG of 21-May-2015 14:35, No significant change was found Confirmed by Kristeen Miss ([id number]) on 12/29/2022 3:08:55 PM     Recent Labs: No results found for requested labs within last 365 days.    Lipid Panel No results found for: "CHOL", "TRIG", "HDL", "CHOLHDL", "VLDL", "LDLCALC", "LDLDIRECT"    Wt Readings from Last 3 Encounters:  12/29/22 256 lb 12.8 oz (116.5 kg)  12/17/21 260 lb 3.2 oz (118 kg)  06/27/21 262 lb (118.8 kg)      Other studies Reviewed: Additional studies/ records that were reviewed today include: . Review of the above records demonstrates:    ASSESSMENT AND PLAN:  1. Atrial fib -       stable .   He is at low risk for his colonoscopy .  May hold his xarelto for 2-3 days  Check CBC today   2. Hypertension-      .  BP is well controlled.   3. Hyperlipidemia -     LDL is 69   4. Pulmonary Hypertension  -   5.  Aortic stenosis/insufficiency:    stable    Current medicines are reviewed at length with the patient today.  The patient does not have concerns regarding medicines.  The following changes have been made:     Labs/ tests ordered today include:   Orders Placed This Encounter  Procedures   EKG 12-Lead    Disposition:   FU with an APP or me  in  1 year   Signed, Kristeen Miss, MD  12/29/2022 3:02 PM    [facility] [address] [place], [place], [place]   [address] Phone: [phone]; Fax: [phone]

## 2022-12-29 ENCOUNTER — Other Ambulatory Visit: Payer: Self-pay | Admitting: Cardiovascular Disease

## 2022-12-29 ENCOUNTER — Encounter: Payer: Self-pay | Admitting: Cardiovascular Disease

## 2022-12-29 ENCOUNTER — Ambulatory Visit: Payer: Medicare HMO | Attending: Cardiovascular Disease | Admitting: Cardiovascular Disease

## 2022-12-29 VITALS — BP 102/60 | HR 85 | Ht 70.0 in | Wt 256.8 lb

## 2022-12-29 DIAGNOSIS — I4819 Other persistent atrial fibrillation: Secondary | ICD-10-CM | POA: Diagnosis not present

## 2022-12-29 DIAGNOSIS — I272 Pulmonary hypertension, unspecified: Secondary | ICD-10-CM | POA: Diagnosis not present

## 2022-12-29 DIAGNOSIS — I4891 Unspecified atrial fibrillation: Secondary | ICD-10-CM | POA: Diagnosis not present

## 2022-12-29 MED ORDER — METOPROLOL SUCCINATE ER 50 MG PO TB24: 50.0000 mg | ORAL_TABLET | Freq: Every day | ORAL | 3 refills | Status: DC

## 2022-12-29 NOTE — Patient Instructions (Signed)
Medication Instructions:  Your physician has recommended you make the following change in your medication:  Stop metoprolol tartrate  Start metoprolol succinate 50 mg by mouth daily   *If you need a refill on your cardiac medications before your next appointment, please call your pharmacy*   Lab Work: Lab work to be done today--CBC If you have labs (blood work) drawn today and your tests are completely normal, you will receive your results only by: [employer] (if you have MyChart) OR A paper copy in the mail If you have any lab test that is abnormal or we need to change your treatment, we will call you to review the results.   Testing/Procedures: none   Follow-Up: At [facility], you and your health needs are our priority.  As part of our continuing mission to provide you with exceptional heart care, we have created designated Provider Care Teams.  These Care Teams include your primary Cardiologist (physician) and Advanced Practice Providers (APPs -  Physician Assistants and Nurse Practitioners) who all work together to provide you with the care you need, when you need it.  We recommend signing up for the patient portal called "MyChart".  Sign up information is provided on this After Visit Summary.  MyChart is used to connect with patients for Virtual Visits (Telemedicine).  Patients are able to view lab/test results, encounter notes, upcoming appointments, etc.  Non-urgent messages can be sent to your provider as well.   To learn more about what you can do with MyChart, go to [email or web address].    Your next appointment:   12 month(s)  Provider:   Kristeen Miss, MD     Other Instructions

## 2022-12-30 LAB — CBC
Hematocrit: 40.6 % (ref 37.5–51.0)
Hemoglobin: 13.8 g/dL (ref 13.0–17.7)
MCH: 32.3 pg (ref 26.6–33.0)
MCHC: 34 g/dL (ref 31.5–35.7)
MCV: 95 fL (ref 79–97)
Platelets: 181 10*3/uL (ref 150–450)
RBC: 4.27 x10E6/uL (ref 4.14–5.80)
RDW: 12.2 % (ref 11.6–15.4)
WBC: 7.1 10*3/uL (ref 3.4–10.8)

## 2023-01-21 NOTE — Telephone Encounter (Signed)
Will forward to pre op APP to review.

## 2023-01-21 NOTE — Telephone Encounter (Signed)
Calling to see the exact number patient needs to hold meds. Please advise

## 2023-01-21 NOTE — Telephone Encounter (Signed)
   Patient Name: Damon Weaver  DOB: [birth year] MRN: [id number]  Primary Cardiologist: Kristeen Miss, MD  Chart reviewed as part of pre-operative protocol coverage.   Please see guidance on holding Xarelto per office visit note enclosed.   Napoleon Form, Leodis Rains, NP 01/21/2023, 2:53 PM

## 2023-01-27 DIAGNOSIS — Z09 Encounter for follow-up examination after completed treatment for conditions other than malignant neoplasm: Secondary | ICD-10-CM | POA: Diagnosis not present

## 2023-01-27 DIAGNOSIS — K5730 Diverticulosis of large intestine without perforation or abscess without bleeding: Secondary | ICD-10-CM | POA: Diagnosis not present

## 2023-01-27 DIAGNOSIS — K648 Other hemorrhoids: Secondary | ICD-10-CM | POA: Diagnosis not present

## 2023-01-27 DIAGNOSIS — Z86010 Personal history of colonic polyps: Secondary | ICD-10-CM | POA: Diagnosis not present

## 2023-02-02 DIAGNOSIS — H906 Mixed conductive and sensorineural hearing loss, bilateral: Secondary | ICD-10-CM | POA: Diagnosis not present

## 2023-02-02 DIAGNOSIS — H61301 Acquired stenosis of right external ear canal, unspecified: Secondary | ICD-10-CM | POA: Diagnosis not present

## 2023-03-12 DIAGNOSIS — H61301 Acquired stenosis of right external ear canal, unspecified: Secondary | ICD-10-CM | POA: Diagnosis not present

## 2023-03-12 DIAGNOSIS — H906 Mixed conductive and sensorineural hearing loss, bilateral: Secondary | ICD-10-CM | POA: Diagnosis not present

## 2023-04-02 ENCOUNTER — Telehealth: Payer: Self-pay | Admitting: *Deleted

## 2023-04-02 ENCOUNTER — Telehealth: Payer: Self-pay | Admitting: Cardiovascular Disease

## 2023-04-02 NOTE — Telephone Encounter (Signed)
Spoke to [facility]. Gave them fax number to fax in surgery clearance for pt having ear surgery on 07/01/23.

## 2023-04-02 NOTE — Telephone Encounter (Signed)
error 

## 2023-04-02 NOTE — Telephone Encounter (Signed)
[facility] is calling to speak with a triage nurse about medication rivaroxaban (XARELTO) 20 MG TABS tablet

## 2023-04-02 NOTE — Telephone Encounter (Signed)
   Pre-operative Risk Assessment    Patient Name: Damon Weaver  DOB: 1945-12-25 MRN: [id number]      Request for Surgical Clearance    Procedure:   Tympanoplasty Canalplasty  Date of Surgery:  Clearance 07/01/23                                 Surgeon:  Dr. Ree Edman Surgeon's Group or Practice Name:  [facility]  Phone number:  [phone] Fax number:  [phone]   Type of Clearance Requested:   - Medical  - Pharmacy:  Hold Rivaroxaban (Xarelto) Not Indicated.   Type of Anesthesia:  Not Indicated   Additional requests/questions:    Signed, Emmit Pomfret   04/02/2023, 1:50 PM

## 2023-04-03 ENCOUNTER — Telehealth: Payer: Self-pay

## 2023-04-03 NOTE — Telephone Encounter (Signed)
Patient scheduled for tele visit on 05/11/23. Med rec and consent done

## 2023-04-03 NOTE — Telephone Encounter (Signed)
  Patient Consent for Virtual Visit         Damon Weaver has provided verbal consent on 04/03/2023 for a virtual visit (video or telephone).   CONSENT FOR VIRTUAL VISIT FOR:  Damon Weaver  By participating in this virtual visit I agree to the following:  I hereby voluntarily request, consent and authorize [facility] and its employed or contracted physicians, physician assistants, nurse practitioners or other licensed health care professionals (the Practitioner), to provide me with telemedicine health care services (the "Services") as deemed necessary by the treating Practitioner. I acknowledge and consent to receive the Services by the Practitioner via telemedicine. I understand that the telemedicine visit will involve communicating with the Practitioner through live audiovisual communication technology and the disclosure of certain medical information by electronic transmission. I acknowledge that I have been given the opportunity to request an in-person assessment or other available alternative prior to the telemedicine visit and am voluntarily participating in the telemedicine visit.  I understand that I have the right to withhold or withdraw my consent to the use of telemedicine in the course of my care at any time, without affecting my right to future care or treatment, and that the Practitioner or I may terminate the telemedicine visit at any time. I understand that I have the right to inspect all information obtained and/or recorded in the course of the telemedicine visit and may receive copies of available information for a reasonable fee.  I understand that some of the potential risks of receiving the Services via telemedicine include:  Delay or interruption in medical evaluation due to technological equipment failure or disruption; Information transmitted may not be sufficient (e.g. poor resolution of images) to allow for appropriate medical decision making by the  Practitioner; and/or  In rare instances, security protocols could fail, causing a breach of personal health information.  Furthermore, I acknowledge that it is my responsibility to provide information about my medical history, conditions and care that is complete and accurate to the best of my ability. I acknowledge that Practitioner's advice, recommendations, and/or decision may be based on factors not within their control, such as incomplete or inaccurate data provided by me or distortions of diagnostic images or specimens that may result from electronic transmissions. I understand that the practice of medicine is not an exact science and that Practitioner makes no warranties or guarantees regarding treatment outcomes. I acknowledge that a copy of this consent can be made available to me via my patient portal [facility] MyChart), or I can request a printed copy by calling the office of [facility].    I understand that my insurance will be billed for this visit.   I have read or had this consent read to me. I understand the contents of this consent, which adequately explains the benefits and risks of the Services being provided via telemedicine.  I have been provided ample opportunity to ask questions regarding this consent and the Services and have had my questions answered to my satisfaction. I give my informed consent for the services to be provided through the use of telemedicine in my medical care

## 2023-04-03 NOTE — Telephone Encounter (Signed)
Patient with diagnosis of atrial fibrillation on Xarelto for anticoagulation.    Procedure:   Tympanoplasty Canalplasty   Date of Surgery:  Clearance 07/01/23       CHA2DS2-VASc Score = 3   This indicates a 3.2% annual risk of stroke. The patient's score is based upon: CHF History: 0 HTN History: 1 Diabetes History: 0 Stroke History: 0 Vascular Disease History: 0 Age Score: 2 Gender Score: 0    CrCl > 100 Platelet count 181  Per office protocol, patient can hold Xarelto for 2 days prior to procedure.   Patient will not need bridging with Lovenox (enoxaparin) around procedure.  **This guidance is not considered finalized until pre-operative APP has relayed final recommendations.**

## 2023-04-03 NOTE — Telephone Encounter (Signed)
   Name: Damon Weaver  DOB: 1945-11-08  MRN: [id number]  Primary Cardiologist: Kristeen Miss, MD   Preoperative team, please contact this patient and set up a phone call appointment on or after 05/01/2023 for further preoperative risk assessment. Please obtain consent and complete medication review. Thank you for your help.  I confirm that guidance regarding antiplatelet and oral anticoagulation therapy has been completed and, if necessary, noted below.  Per office protocol, patient can hold Xarelto for 2 days prior to procedure.   Patient will not need bridging with Lovenox (enoxaparin) around procedure. Please resume Xarelto as soon as possible postprocedure, at the discretion of the surgeon.   I also confirmed the patient resides in the state of [place]. As per [facility] Medical Board telemedicine laws, the patient must reside in the state in which the provider is licensed.   Joylene Grapes, NP 04/03/2023, 3:44 PM [facility]

## 2023-04-22 ENCOUNTER — Telehealth: Payer: Self-pay | Admitting: Cardiovascular Disease

## 2023-04-22 DIAGNOSIS — I4819 Other persistent atrial fibrillation: Secondary | ICD-10-CM

## 2023-04-22 NOTE — Telephone Encounter (Signed)
Returned call to Darl Pikes, call forwarded straight to voicemail. Left message asking that she call back. Last ECHO was 01/08/2018:  Vesta Mixer, MD 01/08/2018  5:17 PM EDT     Moderate pulmonary HTN. Advise continued diet, weight loss. Lets get PFTs .  EF 60-65%.   Follow up PFT's ruled out lung disease as cause of his SOB. Will await return call. Looks like he's scheduled for ear drum repair and widening of external ear canal.

## 2023-04-22 NOTE — Telephone Encounter (Signed)
Darl Pikes called our office back and wanted to inquire on getting a patient an ECHO or what plan was for repeat ECHO. Advised that since last one only showed pulmonary HTN with normal EF, follow-up PFT's were normal, and pt had been seen here annually without any symptoms, a repeat ECHO was not advised at this time. We are happy to order one,but would not be able to get done at any of our locations prior to mid-December per Thea Alken. Darl Pikes will speak with surgeon and if needed call us back, but plan then would be to schedule patient at the hospital for the eardrum surgery rather than outpatient surgery center.

## 2023-04-22 NOTE — Telephone Encounter (Signed)
Darl Pikes, the anesthesiologist called from [facility] regarding her wanting a callback from the nurse about having concerns with the pt's last ECHO so he'd need an updated one done before his procedure. Otherwise if once can't be done before then they'll have to move the surgery to a main site like [facility] or [facility]. Please advise

## 2023-04-23 ENCOUNTER — Telehealth: Payer: Self-pay | Admitting: Cardiovascular Disease

## 2023-04-23 NOTE — Telephone Encounter (Signed)
Call to patient and he states he is already aware of his echo. We need to fix our message system because I was the 5th person to call him about he same thing.

## 2023-04-23 NOTE — Telephone Encounter (Signed)
Spoke with Darl Pikes who states that the patient refused to go to [facility] for his surgery. She states that since the plan is for him to have it done at an outpatient surgery center they will need an echocardiogram prior. Patient's surgery is not until January 8th, 2025.  I have placed an order for an echo.  I attempted to contact the patient to advise him on the order. Left message for patient to call back.

## 2023-04-23 NOTE — Telephone Encounter (Signed)
Follow Up:     Patient is returning Damon Weaver call from today

## 2023-04-23 NOTE — Telephone Encounter (Signed)
Caller Darl Pikes) called to get an update on getting patient an echo test.

## 2023-04-23 NOTE — Addendum Note (Signed)
Addended by: Frutoso Schatz on: 04/23/2023 01:52 PM   Modules accepted: Orders

## 2023-05-05 DIAGNOSIS — H906 Mixed conductive and sensorineural hearing loss, bilateral: Secondary | ICD-10-CM | POA: Diagnosis not present

## 2023-05-05 DIAGNOSIS — H61301 Acquired stenosis of right external ear canal, unspecified: Secondary | ICD-10-CM | POA: Diagnosis not present

## 2023-05-11 ENCOUNTER — Ambulatory Visit: Payer: Medicare HMO | Attending: Cardiology | Admitting: Nurse Practitioner

## 2023-05-11 DIAGNOSIS — Z01810 Encounter for preprocedural cardiovascular examination: Secondary | ICD-10-CM

## 2023-05-11 NOTE — Progress Notes (Signed)
Virtual Visit via Telephone Note   Because of Damon Weaver co-morbid illnesses, he is at least at moderate risk for complications without adequate follow up.  This format is felt to be most appropriate for this patient at this time.  The patient did not have access to video technology/had technical difficulties with video requiring transitioning to audio format only (telephone).  All issues noted in this document were discussed and addressed.  No physical exam could be performed with this format.  Please refer to the patient's chart for his consent to telehealth for [facility].  Evaluation Performed:  Preoperative cardiovascular risk assessment _____________   Date:  05/11/2023   Patient ID:  Damon Weaver, DOB [birth year], MRN [id number] Patient Location:  Home Provider location:   Office  Primary Care Provider:  Blair Heys, MD (Inactive) Primary Cardiologist:  Kristeen Miss, MD  Chief Complaint / Patient Profile   77 y.o. y/o male with a h/o paroxysmal atrial fibrillation, aortic stenosis, aortic insufficiency, hypertension, hyperlipidemia, and pulmonary hypertension who is pending Tympanoplasty/Canalplasty on 07/01/2023 with Dr. Ree [facility] Ne[facility] Surgery and presents today for telephonic preoperative cardiovascular risk assessment.  History of Present Illness    Damon Weaver is a 77 y.o. male who presents via audio/video conferencing for a telehealth visit today.  Pt was last seen in cardiology clinic on 12/29/2022 by Dr. Elease Hashimoto.  At that time Damon Weaver was doing well.  The patient is now pending procedure as outlined above. Since his last visit, he has done well from a cardiac standpoint.   He denies chest pain, palpitations, dyspnea, pnd, orthopnea, n, v, dizziness, syncope, edema, weight gain, or early satiety. All other systems reviewed and are otherwise negative except as noted above.   Past Medical History     Past Medical History:  Diagnosis Date   Arthritis    Dysrhythmia    Afib   Hypertension    Numbness and tingling    Hx: of left leg   OSA (obstructive sleep apnea) 07/25/2015    Mild OSA overall with AHI 8.2/hr and moderate during REM sleep with AHI 21/hr   Seasonal allergies    Past Surgical History:  Procedure Laterality Date   CARDIOVERSION N/A 08/30/2014   Procedure: CARDIOVERSION;  Surgeon: Vesta Mixer, MD;  Location: [facility] ENDOSCOPY;  Service: Cardiovascular;  Laterality: N/A;   COLONOSCOPY W/ BIOPSIES AND POLYPECTOMY     Hx; of   LUMBAR LAMINECTOMY/DECOMPRESSION MICRODISCECTOMY N/A 03/24/2013   Procedure: LUMBAR LAMINECTOMY/DECOMPRESSION MICRODISCECTOMY LUMBAR THREE-FOUR,FOUR-FIVE;  Surgeon: Tia Alert, MD;  Location: [facility] NEURO ORS;  Service: Neurosurgery;  Laterality: N/A;   ROTATOR CUFF REPAIR     Hx of right arm   TOTAL HIP ARTHROPLASTY Left 06/27/2014   DR MURPHY   TOTAL HIP ARTHROPLASTY Left 06/27/2014   Procedure: LEFT TOTAL HIP ARTHROPLASTY ANTERIOR APPROACH;  Surgeon: Sheral Apley, MD;  Location: [facility] OR;  Service: Orthopedics;  Laterality: Left;    Allergies  Allergies  Allergen Reactions   Amlodipine Swelling    Other reaction(s): Edema   Eliquis [Apixaban] Rash    Home Medications    Prior to Admission medications   Medication Sig Start Date End Date Taking? Authorizing Provider  amoxicillin (AMOXIL) 500 MG tablet take 4  tablets 1 hour before dental procedure    [provider]  fluticasone (FLONASE) 50 MCG/ACT nasal spray Place 1 spray into both nostrils 2 (two) times daily as needed (  nasal congestion). 06/27/21   Ellamae Sia, DO  hydrochlorothiazide (HYDRODIURIL) 25 MG tablet TAKE 1 TABLET EVERY DAY 12/29/22   Nahser, Deloris Ping, MD  lisinopril (ZESTRIL) 40 MG tablet Take 40 mg by mouth daily. 12/12/21   [provider]  metoprolol succinate (TOPROL-XL) 50 MG 24 hr tablet Take 1 tablet (50 mg total) by mouth daily. Take with or immediately  following a meal. 12/29/22 03/29/23  Nahser, Deloris Ping, MD  neomycin-polymyxin-hydrocortisone (CORTISPORIN) OTIC solution Place 4 drops into both ears 2 (two) times daily. 07/26/21   [provider]  potassium chloride SA (KLOR-CON M) 20 MEQ tablet TAKE 1 TABLET EVERY DAY 12/29/22   Nahser, Deloris Ping, MD  pravastatin (PRAVACHOL) 40 MG tablet Take 40 mg by mouth at bedtime.     [provider]  rivaroxaban (XARELTO) 20 MG TABS tablet Take 1 tablet (20 mg total) by mouth daily with supper. 01/05/18   Nahser, Deloris Ping, MD    Physical Exam    Vital Signs:  Damon Weaver does not have vital signs available for review today.  Given telephonic nature of communication, physical exam is limited. AAOx3. NAD. Normal affect.  Speech and respirations are unlabored.  Accessory Clinical Findings    None  Assessment & Plan    1.  Preoperative Cardiovascular Risk Assessment:  According to the Revised Cardiac Risk Index (RCRI), his Perioperative Risk of Major Cardiac Event is (%): 0.4. His Functional Capacity in METs is: 7.99 according to the Duke Activity Status Index (DASI). Therefore, based on ACC/AHA guidelines, patient would be at acceptable risk for the planned procedure without further cardiovascular testing.  Of note, he has a routine echocardiogram scheduled 05/28/2023 for monitoring of aortic stenosis/aortic valve regurgitation.  The patient was advised that if he develops new symptoms prior to surgery to contact our office to arrange for a follow-up visit, and he verbalized understanding.  Per office protocol, patient can hold Xarelto for 2 days prior to procedure.  Patient will not need bridging with Lovenox (enoxaparin) around procedure. Please resume Xarelto as soon as possible postprocedure, at the discretion of the surgeon.   A copy of this note will be routed to requesting surgeon.  Time:   Today, I have spent 10 minutes with the patient with telehealth technology discussing  medical history, symptoms, and management plan.     Joylene Grapes, NP  05/11/2023, 2:14 PM

## 2023-05-28 ENCOUNTER — Ambulatory Visit (HOSPITAL_COMMUNITY): Payer: Medicare HMO | Attending: Internal Medicine

## 2023-05-28 DIAGNOSIS — I4819 Other persistent atrial fibrillation: Secondary | ICD-10-CM | POA: Diagnosis not present

## 2023-05-28 LAB — ECHOCARDIOGRAM COMPLETE
AR max vel: 2.01 cm2
AV Area VTI: 1.88 cm2
AV Area mean vel: 1.93 cm2
AV Mean grad: 13.7 mm[Hg]
AV Peak grad: 21.8 mm[Hg]
Ao pk vel: 2.33 m/s
Calc EF: 52.9 %
S' Lateral: 2.75 cm
Single Plane A2C EF: 50.1 %
Single Plane A4C EF: 55.3 %

## 2023-12-18 ENCOUNTER — Other Ambulatory Visit: Payer: Self-pay | Admitting: Cardiovascular Disease

## 2023-12-29 ENCOUNTER — Encounter: Payer: Self-pay | Admitting: Cardiovascular Disease

## 2023-12-29 ENCOUNTER — Ambulatory Visit: Attending: Cardiovascular Disease | Admitting: Cardiovascular Disease

## 2023-12-29 VITALS — BP 130/80 | Ht 70.0 in | Wt 257.2 lb

## 2023-12-29 DIAGNOSIS — I10 Essential (primary) hypertension: Secondary | ICD-10-CM | POA: Diagnosis not present

## 2023-12-29 DIAGNOSIS — I351 Nonrheumatic aortic (valve) insufficiency: Secondary | ICD-10-CM | POA: Insufficient documentation

## 2023-12-29 DIAGNOSIS — E782 Mixed hyperlipidemia: Secondary | ICD-10-CM | POA: Diagnosis not present

## 2023-12-29 DIAGNOSIS — E785 Hyperlipidemia, unspecified: Secondary | ICD-10-CM | POA: Insufficient documentation

## 2023-12-29 DIAGNOSIS — I4819 Other persistent atrial fibrillation: Secondary | ICD-10-CM | POA: Diagnosis not present

## 2023-12-29 NOTE — Assessment & Plan Note (Signed)
 History of hyperlipidemia on pravastatin  with lipid profile performed 10/22/2023 revealing total cholesterol of 172, LDL of 102 and HDL 49.

## 2023-12-29 NOTE — Progress Notes (Signed)
 12/29/2023 Damon Weaver   01/20/1946  [id number]  Primary Physician No primary care provider on file. Primary Cardiologist: Dorn JINNY Lesches MD [facility], [place], [place]  HPI:  Damon Weaver is a 78 y.o. mildly overweight married Caucasian male father of 2, grandfather of 4 grandchildren formally a patient of Dr. Allena.  I am assuming his care in Dr. Allena absence.  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  His risk factors include treated hypertension and hyperlipidemia.  He smoked remotely.  There is no family history for heart disease.  Is never had a heart attack or stroke.  He does have persistent A-fib on Xarelto  and moderate aortic insufficiency which Dr. Alveta has been following by 2D echo.   Current Meds  Medication Sig   fluticasone  (FLONASE ) 50 MCG/ACT nasal spray Place 1 spray into both nostrils 2 (two) times daily as needed (nasal congestion).   hydrochlorothiazide  (HYDRODIURIL ) 25 MG tablet TAKE 1 TABLET EVERY DAY   lisinopril  (ZESTRIL ) 40 MG tablet Take 40 mg by mouth daily.   metoprolol  succinate (TOPROL -XL) 50 MG 24 hr tablet TAKE 1 TABLET EVERY DAY (TAKE WITH OR IMMEDIATELY FOLLOWING A MEAL) (STOP METOPROLOL  TARTRATE)   neomycin-polymyxin-hydrocortisone (CORTISPORIN) OTIC solution Place 4 drops into both ears 2 (two) times daily.   potassium chloride  SA (KLOR-CON  M) 20 MEQ tablet TAKE 1 TABLET EVERY DAY   pravastatin  (PRAVACHOL ) 40 MG tablet Take 40 mg by mouth at bedtime.    rivaroxaban  (XARELTO ) 20 MG TABS tablet Take 1 tablet (20 mg total) by mouth daily with supper.     Allergies  Allergen Reactions   Amlodipine Swelling    Other reaction(s): Edema   Eliquis  [Apixaban ] Rash    Social History   Socioeconomic History   Marital status: Married    Spouse name: Not on file   Number of children: Not on file   Years of education: Not on file   Highest education level: Not on file  Occupational  History   Not on file  Tobacco Use   Smoking status: Former    Current packs/day: 0.00    Average packs/day: 2.0 packs/day for 16.0 years (32.0 ttl pk-yrs)    Types: Cigarettes    Start date: 09/12/1965    Quit date: 09/12/1981    Years since quitting: 42.3   Smokeless tobacco: Never  Substance and Sexual Activity   Alcohol use: Yes    Comment: daily   Drug use: No   Sexual activity: Not on file  Other Topics Concern   Not on file  Social History Narrative   Not on file   Social Drivers of Health   Financial Resource Strain: Not on file  Food Insecurity: Low Risk  (08/04/2023)   Received from [facility]   Hunger Vital Sign    Within the past 12 months, you worried that your food would run out before you got money to buy more: Never true    Within the past 12 months, the food you bought just didn't last and you didn't have money to get more. : Never true  Transportation Needs: No Transportation Needs (08/04/2023)   Received from [employer]    In the past 12 months, has lack of reliable transportation kept you from medical appointments, meetings, work or from getting things needed for daily living? : No  Physical Activity: Not on file  Stress: Not on file  Social Connections: Not on file  Intimate Partner Violence: Not on file     Review of Systems: General: negative for chills, fever, night sweats or weight changes.  Cardiovascular: negative for chest pain, dyspnea on exertion, edema, orthopnea, palpitations, paroxysmal nocturnal dyspnea or shortness of breath Dermatological: negative for rash Respiratory: negative for cough or wheezing Urologic: negative for hematuria Abdominal: negative for nausea, vomiting, diarrhea, bright red blood per rectum, melena, or hematemesis Neurologic: negative for visual changes, syncope, or dizziness All other systems reviewed and are otherwise negative except as noted above.    Blood pressure 130/80, height 5' 10  (1.778 m), weight 257 lb 3.2 oz (116.7 kg), SpO2 94%.  General appearance: alert and no distress Neck: no adenopathy, no carotid bruit, no JVD, supple, symmetrical, trachea midline, and thyroid not enlarged, symmetric, no tenderness/mass/nodules Lungs: clear to auscultation bilaterally Heart: irregularly irregular rhythm Extremities: extremities normal, atraumatic, no cyanosis or edema Pulses: 2+ and symmetric Skin: Skin color, texture, turgor normal. No rashes or lesions Neurologic: Grossly normal  EKG EKG Interpretation Date/Time:  Tuesday December 29 2023 11:32:15 EDT Ventricular Rate:  86 PR Interval:    QRS Duration:  86 QT Interval:  380 QTC Calculation: 454 R Axis:   41  Text Interpretation: Atrial fibrillation When compared with ECG of 29-Dec-2022 15:00, No significant change was found Confirmed by Court Carrier [phone]) on 12/29/2023 11:36:45 AM    ASSESSMENT AND PLAN:   Persistent atrial fibrillation (HCC) History of persistent A-fib rate controlled on Xarelto  oral anticoagulation.  HTN (hypertension) History of essential hypertension with blood pressure measured today at 130/80.  He is on hydrochlorothiazide , lisinopril  and metoprolol .  Hyperlipidemia History of hyperlipidemia on pravastatin  with lipid profile performed 10/22/2023 revealing total cholesterol of 172, LDL of 102 and HDL 49.  Aortic insufficiency 2D echocardiogram performed 05/28/2023 revealed normal LV systolic function, mild MR, mild AS with moderate AI.  Cardiac dimensions were normal.  He is asymptomatic.  Will follow this on an annual basis.     Carrier DOROTHA Court MD FACP,FACC,FAHA, [facility] 12/29/2023 11:54 AM

## 2023-12-29 NOTE — Patient Instructions (Addendum)
 Medication Instructions:  Your physician recommends that you continue on your current medications as directed. Please refer to the Current Medication list given to you today.  *If you need a refill on your cardiac medications before your next appointment, please call your pharmacy*  Testing/Procedures: Your physician has requested that you have an echocardiogram. Echocardiography is a painless test that uses sound waves to create images of your heart. It provides your doctor with information about the size and shape of your heart and how well your heart's chambers and valves are working. This procedure takes approximately one hour. There are no restrictions for this procedure. Please do NOT wear cologne, perfume, aftershave, or lotions (deodorant is allowed). Please arrive 15 minutes prior to your appointment time.  Please note: We ask at that you not bring children with you during ultrasound (echo/ vascular) testing. Due to room size and safety concerns, children are not allowed in the ultrasound rooms during exams. Our front office staff cannot provide observation of children in our lobby area while testing is being conducted. An adult accompanying a patient to their appointment will only be allowed in the ultrasound room at the discretion of the ultrasound technician under special circumstances. We apologize for any inconvenience. **To do in December**   Follow-Up: At [facility], you and your health needs are our priority.  As part of our continuing mission to provide you with exceptional heart care, our providers are all part of one team.  This team includes your primary Cardiologist (physician) and Advanced Practice Providers or APPs (Physician Assistants and Nurse Practitioners) who all work together to provide you with the care you need, when you need it.  Your next appointment:   12 month(s)  Provider:   Dorn Lesches, MD    We recommend signing up for the patient portal  called MyChart.  Sign up information is provided on this After Visit Summary.  MyChart is used to connect with patients for Virtual Visits (Telemedicine).  Patients are able to view lab/test results, encounter notes, upcoming appointments, etc.  Non-urgent messages can be sent to your provider as well.   To learn more about what you can do with MyChart, go to [email or web address].

## 2023-12-29 NOTE — Assessment & Plan Note (Signed)
History of persistent A. fib rate controlled on Xarelto oral anticoagulation. 

## 2023-12-29 NOTE — Assessment & Plan Note (Signed)
 2D echocardiogram performed 05/28/2023 revealed normal LV systolic function, mild MR, mild AS with moderate AI.  Cardiac dimensions were normal.  He is asymptomatic.  Will follow this on an annual basis.

## 2023-12-29 NOTE — Assessment & Plan Note (Signed)
 History of essential hypertension with blood pressure measured today at 130/80.  He is on hydrochlorothiazide , lisinopril  and metoprolol .

## 2024-02-10 NOTE — Progress Notes (Unsigned)
 Darlyn Claudene Damon Weaver [address] [place] [address] Phone: [phone] Subjective:   LILLETTE Berwyn Posey, am serving as a scribe for Dr. Arthea Claudene.  I'm seeing this patient by the request  of:  [name], DO  CC: Right knee and right hip pain  Damon Weaver  [name] is a 78 y.o. male coming in with complaint of R knee and R hip pain. Anterior knee pain for years. Took a trip recently where he walked on uneven ground and this exacerbated his pain. Take IBU prn.   Pain in glute when walking during trip.    L great toe on plantar surface with weight bearing.       Past Medical History:  Diagnosis Date   Arthritis    Dysrhythmia    Afib   Hypertension    Numbness and tingling    Hx: of left leg   OSA (obstructive sleep apnea) 07/25/2015    Mild OSA overall with AHI 8.2/hr and moderate during REM sleep with AHI 21/hr   Seasonal allergies    Past Surgical History:  Procedure Laterality Date   CARDIOVERSION N/A 08/30/2014   Procedure: CARDIOVERSION;  Surgeon: Aleene JINNY Passe, MD;  Location[facility] ENDOSCOPY;  Service: Cardiovascular;  Laterality: N/A;   COLONOSCOPY W/ BIOPSIES AND POLYPECTOMY     Hx; of   LUMBAR LAMINECTOMY/DECOMPRESSION MICRODISCECTOMY N/A 03/24/2013   Procedure: LUMBAR LAMINECTOMY/DECOMPRESSION MICRODISCECTOMY LUMBAR THREE-FOUR,FOUR-FIVE;  Surgeon: Alm GORMAN Molt, MD;  Location: [facility] NEURO ORS;  Service: Neurosurgery;  Laterality: N/A;   ROTATOR CUFF REPAIR     Hx of right arm   TOTAL HIP ARTHROPLASTY Left 06/27/2014   DR MURPHY   TOTAL HIP ARTHROPLASTY Left 06/27/2014   Procedure: LEFT TOTAL HIP ARTHROPLASTY ANTERIOR APPROACH;  Surgeon: Evalene JONETTA Chancy, MD;  Location: [facility] OR;  Service: Orthopedics;  Laterality: Left;   Social History   Socioeconomic History   Marital status: Married    Spouse name: Not on file   Number of children: Not on file   Years of education: Not on file   Highest education level: Not on file   Occupational History   Not on file  Tobacco Use   Smoking status: Former    Current packs/day: 0.00    Average packs/day: 2.0 packs/day for 16.0 years (32.0 ttl pk-yrs)    Types: Cigarettes    Start date: 09/12/1965    Quit date: 09/12/1981    Years since quitting: 42.4   Smokeless tobacco: Never  Substance and Sexual Activity   Alcohol use: Yes    Comment: daily   Drug use: No   Sexual activity: Not on file  Other Topics Concern   Not on file  Social History Narrative   Not on file   Social Drivers of Health   Financial Resource Strain: Not on file  Food Insecurity: Low Risk  (08/04/2023)   Received from [facility]   Hunger Vital Sign    Within the past 12 months, you worried that your food would run out before you got money to buy more: Never true    Within the past 12 months, the food you bought just didn't last and you didn't have money to get more. : Never true  Transportation Needs: No Transportation Needs (08/04/2023)   Received from [employer]    In the past 12 months, has lack of reliable transportation kept you from medical appointments, meetings, work or from getting things needed  for daily living? : No  Physical Activity: Not on file  Stress: Not on file  Social Connections: Not on file   Allergies  Allergen Reactions   Amlodipine Swelling    Other reaction(s): Edema   Eliquis  [Apixaban ] Rash   Family History  Problem Relation Age of Onset   Arthritis Other      Current Outpatient Medications (Cardiovascular):    hydrochlorothiazide  (HYDRODIURIL ) 25 MG tablet, TAKE 1 TABLET EVERY DAY   lisinopril  (ZESTRIL ) 40 MG tablet, Take 40 mg by mouth daily.   metoprolol  succinate (TOPROL -XL) 50 MG 24 hr tablet, TAKE 1 TABLET EVERY DAY (TAKE WITH OR IMMEDIATELY FOLLOWING A MEAL) (STOP METOPROLOL  TARTRATE)   pravastatin  (PRAVACHOL ) 40 MG tablet, Take 40 mg by mouth at bedtime.   Current Outpatient Medications (Respiratory):    fluticasone   (FLONASE ) 50 MCG/ACT nasal spray, Place 1 spray into both nostrils 2 (two) times daily as needed (nasal congestion).   Current Outpatient Medications (Hematological):    rivaroxaban  (XARELTO ) 20 MG TABS tablet, Take 1 tablet (20 mg total) by mouth daily with supper.  Current Outpatient Medications (Other):    neomycin-polymyxin-hydrocortisone (CORTISPORIN) OTIC solution, Place 4 drops into both ears 2 (two) times daily.   potassium chloride  SA (KLOR-CON  M) 20 MEQ tablet, TAKE 1 TABLET EVERY DAY   Reviewed prior external information including notes and imaging from  primary care provider As well as notes that were available from care everywhere and other healthcare systems.  Past medical history, social, surgical and family history all reviewed in electronic medical record.  No pertanent information unless stated regarding to the chief complaint.   Review of Systems:  No headache, visual changes, nausea, vomiting, diarrhea, constipation, dizziness, abdominal pain, skin rash, fevers, chills, night sweats, weight loss, swollen lymph nodes, body aches, joint swelling, chest pain, shortness of breath, mood changes. POSITIVE muscle aches  Objective  Blood pressure 128/86, pulse 81, height 5' 10 (1.778 m), weight 257 lb (116.6 kg), SpO2 96%.   General: No apparent distress alert and oriented x3 mood and affect normal, dressed appropriately.  HEENT: Pupils equal, extraocular movements intact  Respiratory: Patient's speak in full sentences and does not appear short of breath  Cardiovascular: No lower extremity edema, non tender, no erythema  Knee exam shows shows that there is significant effusion noted.  Lacking the last 15 degrees of flexion.  Tender to palpation noted.  Lateral tracking of the patella noted.  Hip exam shows mild tenderness over the greater trochanteric area.  Limited muscular skeletal ultrasound was performed and interpreted by CLAUDENE HUSSAR, M  Limited ultrasound shows  significant effusion noted and significant narrowing noted as well.  This is of the patellofemoral joint noted.  An acute on chronic lateral meniscus injury does appear with some mild Impression: Patellofemoral arthritis with a degenerative lateral meniscal tear.  After informed written and verbal consent, patient was seated on exam table. Right knee was prepped with alcohol swab and utilizing anterolateral approach, patient's right knee space was injected with 4:1  marcaine  0.5%: Kenalog 40mg /dL. Patient tolerated the procedure well without immediate complications.     Impression and Recommendations:     The above documentation has been reviewed and is accurate and complete Emilliano Dilworth M Alonna Bartling, DO

## 2024-02-11 ENCOUNTER — Encounter: Payer: Self-pay | Admitting: Family Medicine

## 2024-02-11 ENCOUNTER — Ambulatory Visit

## 2024-02-11 ENCOUNTER — Other Ambulatory Visit: Payer: Self-pay

## 2024-02-11 ENCOUNTER — Ambulatory Visit: Admitting: Family Medicine

## 2024-02-11 VITALS — BP 128/86 | HR 81 | Ht 70.0 in | Wt 257.0 lb

## 2024-02-11 DIAGNOSIS — M1711 Unilateral primary osteoarthritis, right knee: Secondary | ICD-10-CM | POA: Diagnosis not present

## 2024-02-11 DIAGNOSIS — M25561 Pain in right knee: Secondary | ICD-10-CM

## 2024-02-11 NOTE — Patient Instructions (Signed)
 You have 14 days to return or exchange your brace Call [phone], then return the brace to our office Do prescribed exercises at least 3x a week Xray today See you again in 2 months

## 2024-02-11 NOTE — Assessment & Plan Note (Signed)
 Patient given injection and tolerated the procedure well, discussed icing regimen and home exercises, discussed avoiding certain activities.  Tru pull lite brace given. F/u will monitor and see if anything such as viscosupplementation is needed.  Believe that this is giving him some of the right hip and left toe pain and we will further evaluate at next follow-up depending upon how patient responds.

## 2024-02-23 ENCOUNTER — Ambulatory Visit: Payer: Self-pay | Admitting: Family Medicine

## 2024-04-11 ENCOUNTER — Other Ambulatory Visit: Payer: Self-pay

## 2024-04-11 MED ORDER — METOPROLOL SUCCINATE ER 50 MG PO TB24: 50.0000 mg | ORAL_TABLET | Freq: Every day | ORAL | 1 refills | Status: DC

## 2024-04-11 MED ORDER — HYDROCHLOROTHIAZIDE 25 MG PO TABS
25.0000 mg | ORAL_TABLET | Freq: Every day | ORAL | 2 refills | Status: AC
Start: 2024-04-11 — End: ?

## 2024-04-11 MED ORDER — POTASSIUM CHLORIDE CRYS ER 20 MEQ PO TBCR: 20.0000 meq | EXTENDED_RELEASE_TABLET | Freq: Every day | ORAL | 2 refills | Status: AC

## 2024-04-12 NOTE — Progress Notes (Addendum)
 [facility] [address] [place] [address] Phone: [phone] Subjective:   Damon Weaver, am serving as a scribe for Dr. Arthea Weaver.  I'm seeing this patient by the request  of:  Damon Opal, DO  CC: Right knee pain follow-up  [name]  02/11/2024 Patient given injection and tolerated the procedure well, discussed icing regimen and home exercises, discussed avoiding certain activities.  Tru pull lite brace given. F/u will monitor and see if anything such as viscosupplementation is needed.  Believe that this is giving him some of the right hip and left toe pain and we will further evaluate at next follow-up depending upon how patient responds.      Update 04/13/2024 Damon Weaver is a 78 y.o. male coming in with complaint of R knee pain.  Found to have some underlying arthritis and chondrocalcinosis.  Given steroid injection at last follow-up.  Patient states injection did work. About 2 weeks ago went [place] about 650 [place]. Up until that point knee was doing great.  Xray R knee 02/11/2024 IMPRESSION: 1. Mild tricompartmental osteoarthritis. 2. Faint chondrocalcinosis. 3. Questionable ossification in the suprapatellar joint space.     Past Medical History:  Diagnosis Date   Arthritis    Dysrhythmia    Afib   Hypertension    Numbness and tingling    Hx: of left leg   OSA (obstructive sleep apnea) 07/25/2015    Mild OSA overall with AHI 8.2/hr and moderate during REM sleep with AHI 21/hr   Seasonal allergies    Past Surgical History:  Procedure Laterality Date   CARDIOVERSION N/A 08/30/2014   Procedure: CARDIOVERSION;  Surgeon: Aleene JINNY Passe, MD;  Location: [facility] ENDOSCOPY;  Service: Cardiovascular;  Laterality: N/A;   COLONOSCOPY W/ BIOPSIES AND POLYPECTOMY     Hx; of   LUMBAR LAMINECTOMY/DECOMPRESSION MICRODISCECTOMY N/A 03/24/2013   Procedure: LUMBAR LAMINECTOMY/DECOMPRESSION MICRODISCECTOMY LUMBAR  THREE-FOUR,FOUR-FIVE;  Surgeon: Alm GORMAN Molt, MD;  Location: [facility] NEURO ORS;  Service: Neurosurgery;  Laterality: N/A;   ROTATOR CUFF REPAIR     Hx of right arm   TOTAL HIP ARTHROPLASTY Left 06/27/2014   DR MURPHY   TOTAL HIP ARTHROPLASTY Left 06/27/2014   Procedure: LEFT TOTAL HIP ARTHROPLASTY ANTERIOR APPROACH;  Surgeon: Evalene JONETTA Chancy, MD;  Location: [facility] OR;  Service: Orthopedics;  Laterality: Left;   Social History   Socioeconomic History   Marital status: Married    Spouse name: Not on file   Number of children: Not on file   Years of education: Not on file   Highest education level: Not on file  Occupational History   Not on file  Tobacco Use   Smoking status: Former    Current packs/day: 0.00    Average packs/day: 2.0 packs/day for 16.0 years (32.0 ttl pk-yrs)    Types: Cigarettes    Start date: 09/12/1965    Quit date: 09/12/1981    Years since quitting: 42.6   Smokeless tobacco: Never  Substance and Sexual Activity   Alcohol use: Yes    Comment: daily   Drug use: No   Sexual activity: Not on file  Other Topics Concern   Not on file  Social History Narrative   Not on file   Social Drivers of Health   Financial Resource Strain: Not on file  Food Insecurity: Low Risk  (08/04/2023)   Received from [facility]   Hunger Vital Sign    Within the past  12 months, you worried that your food would run out before you got money to buy more: Never true    Within the past 12 months, the food you bought just didn't last and you didn't have money to get more. : Never true  Transportation Needs: No Transportation Needs (08/04/2023)   Received from [employer]    In the past 12 months, has lack of reliable transportation kept you from medical appointments, meetings, work or from getting things needed for daily living? : No  Physical Activity: Not on file  Stress: Not on file  Social Connections: Not on file   Allergies  Allergen Reactions   Amlodipine  Swelling    Other reaction(s): Edema   Eliquis  [Apixaban ] Rash   Family History  Problem Relation Age of Onset   Arthritis Other      Current Outpatient Medications (Cardiovascular):    hydrochlorothiazide  (HYDRODIURIL ) 25 MG tablet, Take 1 tablet (25 mg total) by mouth daily.   lisinopril  (ZESTRIL ) 40 MG tablet, Take 40 mg by mouth daily.   metoprolol  succinate (TOPROL -XL) 50 MG 24 hr tablet, Take 1 tablet (50 mg total) by mouth daily. Take with or immediately following a meal.   pravastatin  (PRAVACHOL ) 40 MG tablet, Take 40 mg by mouth at bedtime.   Current Outpatient Medications (Respiratory):    fluticasone  (FLONASE ) 50 MCG/ACT nasal spray, Place 1 spray into both nostrils 2 (two) times daily as needed (nasal congestion).   Current Outpatient Medications (Hematological):    rivaroxaban  (XARELTO ) 20 MG TABS tablet, Take 1 tablet (20 mg total) by mouth daily with supper.  Current Outpatient Medications (Other):    neomycin-polymyxin-hydrocortisone (CORTISPORIN) OTIC solution, Place 4 drops into both ears 2 (two) times daily.   potassium chloride  SA (KLOR-CON  M) 20 MEQ tablet, Take 1 tablet (20 mEq total) by mouth daily.   Reviewed prior external information including notes and imaging from  primary care provider As well as notes that were available from care everywhere and other healthcare systems.  Past medical history, social, surgical and family history all reviewed in electronic medical record.  No pertanent information unless stated regarding to the chief complaint.   Review of Systems:  No headache, visual changes, nausea, vomiting, diarrhea, constipation, dizziness, abdominal pain, skin rash, fevers, chills, night sweats, weight loss, swollen lymph nodes, body aches, E muscle aches, joint swelling  Objective  Blood pressure 132/84, pulse 71, height 5' 10 (1.778 m), weight 258 lb (117 kg), SpO2 97%.   General: No apparent distress alert and oriented x3 mood and affect  normal, dressed appropriately.  HEENT: Pupils equal, extraocular movements intact  Respiratory: Patient's speak in full sentences and does not appear short of breath  Cardiovascular: No lower extremity edema, non tender, no erythema  Knee exam shows arthritic changes noted.  Trace effusion noted in the morning and patient has some limited range of motion with flexion and extension.  Instability noted of the patellofemoral joint  After informed written and verbal consent, patient was seated on exam table. Right knee was prepped with alcohol swab and utilizing anterolateral approach, patient's right knee space was injected with 4:1  marcaine  0.5%: depomedrol 40mg /dL. Patient tolerated the procedure well without immediate complications.    Impression and Recommendations:     The above documentation has been reviewed and is accurate and complete Cuong Moorman M Kelso Bibby, DO

## 2024-04-13 ENCOUNTER — Encounter: Payer: Self-pay | Admitting: Family Medicine

## 2024-04-13 ENCOUNTER — Telehealth: Payer: Self-pay

## 2024-04-13 ENCOUNTER — Ambulatory Visit: Admitting: Family Medicine

## 2024-04-13 VITALS — BP 132/84 | HR 71 | Ht 70.0 in | Wt 258.0 lb

## 2024-04-13 DIAGNOSIS — M1711 Unilateral primary osteoarthritis, right knee: Secondary | ICD-10-CM | POA: Diagnosis not present

## 2024-04-13 NOTE — Patient Instructions (Addendum)
 Injection in knee today Good to see you! See you again in 2 months (before the end of the year)

## 2024-04-13 NOTE — Telephone Encounter (Signed)
 Patient ran for Monovisc for right knee on 04/13/24. Case [id number]. Pending approval.

## 2024-04-13 NOTE — Assessment & Plan Note (Signed)
 Repeat injection given again today.  Discussed the ossification on the x-rays.  Still think patient has significant time before we discussed any other difficulties.  Could be candidate for viscosupplementation and we will try to get approval again.  Discussed icing regimen and home exercises, increase activity slowly.  Follow-up again in 6 to 12 weeks

## 2024-04-13 NOTE — Telephone Encounter (Signed)
-----   Message from Berwyn Posey sent at 04/13/2024  8:12 AM EDT ----- Regarding: visco Can you please run patient for R knee visco injection?  Thank you

## 2024-04-15 NOTE — Telephone Encounter (Signed)
 Patient scheduled 06/02/24  Monovisc authorized for right knee NO PRE CERT REQUIRED  Deductible does not apply Copay $15 OOP MAX [id number] has met $517.37 Reference # [id number]

## 2024-04-18 NOTE — Telephone Encounter (Signed)
 Noted

## 2024-05-30 ENCOUNTER — Ambulatory Visit (HOSPITAL_COMMUNITY)
Admission: RE | Admit: 2024-05-30 | Discharge: 2024-05-30 | Disposition: A | Source: Ambulatory Visit | Attending: Cardiovascular Disease | Admitting: Cardiovascular Disease

## 2024-05-30 DIAGNOSIS — I351 Nonrheumatic aortic (valve) insufficiency: Secondary | ICD-10-CM

## 2024-05-30 DIAGNOSIS — I10 Essential (primary) hypertension: Secondary | ICD-10-CM | POA: Diagnosis not present

## 2024-05-30 DIAGNOSIS — I4819 Other persistent atrial fibrillation: Secondary | ICD-10-CM | POA: Diagnosis not present

## 2024-05-31 ENCOUNTER — Ambulatory Visit: Payer: Self-pay | Admitting: Cardiovascular Disease

## 2024-05-31 DIAGNOSIS — I4819 Other persistent atrial fibrillation: Secondary | ICD-10-CM

## 2024-05-31 DIAGNOSIS — I351 Nonrheumatic aortic (valve) insufficiency: Secondary | ICD-10-CM

## 2024-05-31 DIAGNOSIS — I10 Essential (primary) hypertension: Secondary | ICD-10-CM

## 2024-05-31 LAB — ECHOCARDIOGRAM COMPLETE
AR max vel: 1.07 cm2
AV Area VTI: 1.25 cm2
AV Area mean vel: 1 cm2
AV Mean grad: 15 mmHg
AV Peak grad: 24.2 mmHg
Ao pk vel: 2.46 m/s
Area-P 1/2: 5.13 cm2
P 1/2 time: 1084 ms
S' Lateral: 2.8 cm

## 2024-06-02 ENCOUNTER — Ambulatory Visit: Admitting: Family Medicine

## 2024-06-08 NOTE — Progress Notes (Unsigned)
 [facility] [address] [place] [address] Phone: [phone] Subjective:   Damon Weaver am a scribe for Dr. Claudene.   I'm seeing this patient by the request  of:  [name], DO  CC: Right knee pain follow-up  [name]  04/13/2024 Repeat injection given again today.  Discussed the ossification on the x-rays.  Still think patient has significant time before we discussed any other difficulties.  Could be candidate for viscosupplementation and we will try to get approval again.  Discussed icing regimen and home exercises, increase activity slowly.  Follow-up again in 6 to 12 weeks      Update 06/09/2024 Damon Weaver is a 78 y.o. male coming in with complaint of R knee pain. Monovisc approved. Patient states that the right knee is ok today. One day last week he walked a couple of miles and when he got home it was throbbing. It is doing better than it once was but still having some issues.        Past Medical History:  Diagnosis Date   Arthritis    Dysrhythmia    Afib   Hypertension    Numbness and tingling    Hx: of left leg   OSA (obstructive sleep apnea) 07/25/2015    Mild OSA overall with AHI 8.2/hr and moderate during REM sleep with AHI 21/hr   Seasonal allergies    Past Surgical History:  Procedure Laterality Date   CARDIOVERSION N/A 08/30/2014   Procedure: CARDIOVERSION;  Surgeon: Aleene JINNY Passe, MD;  Location: [facility] ENDOSCOPY;  Service: Cardiovascular;  Laterality: N/A;   COLONOSCOPY W/ BIOPSIES AND POLYPECTOMY     Hx; of   LUMBAR LAMINECTOMY/DECOMPRESSION MICRODISCECTOMY N/A 03/24/2013   Procedure: LUMBAR LAMINECTOMY/DECOMPRESSION MICRODISCECTOMY LUMBAR THREE-FOUR,FOUR-FIVE;  Surgeon: Alm GORMAN Molt, MD;  Location: [facility] NEURO ORS;  Service: Neurosurgery;  Laterality: N/A;   ROTATOR CUFF REPAIR     Hx of right arm   TOTAL HIP ARTHROPLASTY Left 06/27/2014   DR MURPHY   TOTAL HIP ARTHROPLASTY Left 06/27/2014   Procedure:  LEFT TOTAL HIP ARTHROPLASTY ANTERIOR APPROACH;  Surgeon: Evalene JONETTA Chancy, MD;  Location: [facility] OR;  Service: Orthopedics;  Laterality: Left;   Social History   Socioeconomic History   Marital status: Married    Spouse name: Not on file   Number of children: Not on file   Years of education: Not on file   Highest education level: Not on file  Occupational History   Not on file  Tobacco Use   Smoking status: Former    Current packs/day: 0.00    Average packs/day: 2.0 packs/day for 16.0 years (32.0 ttl pk-yrs)    Types: Cigarettes    Start date: 09/12/1965    Quit date: 09/12/1981    Years since quitting: 42.7   Smokeless tobacco: Never  Substance and Sexual Activity   Alcohol use: Yes    Comment: daily   Drug use: No   Sexual activity: Not on file  Other Topics Concern   Not on file  Social History Narrative   Not on file   Social Drivers of Health   Tobacco Use: Medium Risk (04/18/2024)   Received from [facility]   Patient History    Passive Exposure: Past    Smoking Tobacco Use: Former    Smokeless Tobacco Use: Never  Physicist, Medical Strain: Not on file  Food Insecurity: Low Risk (08/04/2023)   Received from [employer]  Within the past 12 months, you worried that your food would run out before you got money to buy more: Never true    Within the past 12 months, the food you bought just didn't last and you didn't have money to get more. : Never true  Transportation Needs: No Transportation Needs (08/04/2023)   Received from [employer]    In the past 12 months, has lack of reliable transportation kept you from medical appointments, meetings, work or from getting things needed for daily living? : No  Physical Activity: Not on file  Stress: Not on file  Social Connections: Not on file  Depression (EYV7-0): Not on file  Alcohol Screen: Not on file  Housing: Low Risk (08/04/2023)   Received from [facility]   Epic    What is your  living situation today?: I have a steady place to live    Think about the place you live. Do you have problems with any of the following? Choose all that apply:: None/None on this list  Utilities: Low Risk (08/04/2023)   Received from [facility]   Utilities    In the past 12 months has the electric, gas, oil, or water company threatened to shut off services in your home? : No  Health Literacy: Not on file   Allergies[1] Family History  Problem Relation Age of Onset   Arthritis Other     Current Outpatient Medications (Cardiovascular):    hydrochlorothiazide  (HYDRODIURIL ) 25 MG tablet, Take 1 tablet (25 mg total) by mouth daily.   lisinopril  (ZESTRIL ) 40 MG tablet, Take 40 mg by mouth daily.   metoprolol  succinate (TOPROL -XL) 50 MG 24 hr tablet, Take 1 tablet (50 mg total) by mouth daily. Take with or immediately following a meal.   pravastatin  (PRAVACHOL ) 40 MG tablet, Take 40 mg by mouth at bedtime.   Current Outpatient Medications (Respiratory):    fluticasone  (FLONASE ) 50 MCG/ACT nasal spray, Place 1 spray into both nostrils 2 (two) times daily as needed (nasal congestion).  Current Outpatient Medications (Hematological):    rivaroxaban  (XARELTO ) 20 MG TABS tablet, Take 1 tablet (20 mg total) by mouth daily with supper.  Current Outpatient Medications (Other):    neomycin-polymyxin-hydrocortisone (CORTISPORIN) OTIC solution, Place 4 drops into both ears 2 (two) times daily.   potassium chloride  SA (KLOR-CON  M) 20 MEQ tablet, Take 1 tablet (20 mEq total) by mouth daily.  Current Facility-Administered Medications (Other):    Hyaluronan (MONOVISC) intra-articular injection 88 mg   Reviewed prior external information including notes and imaging from  primary care provider As well as notes that were available from care everywhere and other healthcare systems.  Past medical history, social, surgical and family history all reviewed in electronic medical record.  No pertanent  information unless stated regarding to the chief complaint.   Review of Systems:  No headache, visual changes, nausea, vomiting, diarrhea, constipation, dizziness, abdominal pain, skin rash, fevers, chills, night sweats, weight loss, swollen lymph nodes, body aches, joint swelling, chest pain, shortness of breath, mood changes. POSITIVE muscle aches  Objective  Blood pressure 124/60, pulse 98, height 5' 10 (1.778 m), weight 261 lb (118.4 kg), SpO2 100%.   General: No apparent distress alert and oriented x3 mood and affect normal, dressed appropriately.  HEENT: Pupils equal, extraocular movements intact  Respiratory: Patient's speak in full sentences and does not appear short of breath  Cardiovascular: No lower extremity edema, non tender, no erythema  Right knee does have still some  crepitus noted.  Trace effusion noted of the patellofemoral joint.  Instability with valgus and varus force.  After informed written and verbal consent, patient was seated on exam table. Right knee was prepped with alcohol swab and utilizing anterolateral approach, patient's right knee space was injected with 48 mg per 3 mL of Monovisc (sodium hyaluronate) in a prefilled syringe was injected easily into the knee through a 22-gauge needle..Patient tolerated the procedure well without immediate complications.    Impression and Recommendations:     The above documentation has been reviewed and is accurate and complete Arthea CHRISTELLA Sharps, DO       [1]  Allergies Allergen Reactions   Amlodipine Swelling    Other reaction(s): Edema   Eliquis  [Apixaban ] Rash

## 2024-06-09 ENCOUNTER — Ambulatory Visit: Admitting: Family Medicine

## 2024-06-09 ENCOUNTER — Encounter: Payer: Self-pay | Admitting: Family Medicine

## 2024-06-09 VITALS — BP 124/60 | HR 98 | Ht 70.0 in | Wt 261.0 lb

## 2024-06-09 DIAGNOSIS — M1711 Unilateral primary osteoarthritis, right knee: Secondary | ICD-10-CM | POA: Diagnosis not present

## 2024-06-09 MED ORDER — HYALURONAN 88 MG/4ML IX SOSY
88.0000 mg | PREFILLED_SYRINGE | Freq: Once | INTRA_ARTICULAR | Status: AC
  Administered 2024-06-09: 12:00:00 88 mg via INTRA_ARTICULAR

## 2024-06-09 NOTE — Patient Instructions (Addendum)
 Good to see you. Monovisc injection 10-12 weeks

## 2024-06-09 NOTE — Assessment & Plan Note (Signed)
 Viscosupplementation given today.  Tolerated the procedure well.  Did have some aspiration done as well.  Discussed icing regimen and home exercises, discussed which activities to do and which ones to avoid.  Follow-up again in 6 to 8 weeks otherwise.

## 2024-06-27 ENCOUNTER — Telehealth: Payer: Self-pay

## 2024-06-27 NOTE — Telephone Encounter (Signed)
 Monovisc benefits ran for right knee Case ID [id number]

## 2024-06-27 NOTE — Telephone Encounter (Signed)
-----   Message from [facility] Trayce J sent at 06/27/2024 10:48 AM EST ----- Regarding: FW: gel check for new year  ----- Message ----- From: Desiderio Joesph SAUNDERS Sent: 06/09/2024  11:56 AM EST To: Claretha JONETTA Schimke, CMA Subject: gel check for new year                         Check gel for new year please

## 2024-07-01 NOTE — Telephone Encounter (Signed)
 Scheduled 08/18/24  Monovisc authorized for right knee NO PRE CERT REQUIRED  Deductible does not apply Copay $10 OOP MAX $3950 has met $0 Once OOP has been met coverage goes to 100% Reference # [id number]

## 2024-07-04 NOTE — Telephone Encounter (Signed)
 Noted

## 2024-07-27 ENCOUNTER — Other Ambulatory Visit: Payer: Self-pay | Admitting: Cardiovascular Disease

## 2024-08-18 ENCOUNTER — Ambulatory Visit: Admitting: Family Medicine
